# Patient Record
Sex: Female | Born: 1990
Health system: Southern US, Community
[De-identification: ages and names within clinical notes are randomized; demographics above are authoritative.]

## PROBLEM LIST (undated history)

## (undated) ENCOUNTER — Inpatient Hospital Stay (HOSPITAL_COMMUNITY): Payer: Self-pay

## (undated) DIAGNOSIS — Z789 Other specified health status: Secondary | ICD-10-CM

## (undated) DIAGNOSIS — E7800 Pure hypercholesterolemia, unspecified: Secondary | ICD-10-CM

## (undated) DIAGNOSIS — D649 Anemia, unspecified: Secondary | ICD-10-CM

## (undated) DIAGNOSIS — Z973 Presence of spectacles and contact lenses: Secondary | ICD-10-CM

## (undated) DIAGNOSIS — R519 Headache, unspecified: Secondary | ICD-10-CM

## (undated) HISTORY — PX: NO PAST SURGERIES: SHX2092

## (undated) HISTORY — PX: THERAPEUTIC ABORTION: SHX798

---

## 2012-03-27 ENCOUNTER — Ambulatory Visit (INDEPENDENT_AMBULATORY_CARE_PROVIDER_SITE_OTHER): Payer: 59 | Admitting: Family Medicine

## 2012-03-27 VITALS — BP 103/70 | HR 59 | Temp 98.3°F | Resp 16 | Ht 62.5 in | Wt 158.4 lb

## 2012-03-27 DIAGNOSIS — N949 Unspecified condition associated with female genital organs and menstrual cycle: Secondary | ICD-10-CM

## 2012-03-27 DIAGNOSIS — R350 Frequency of micturition: Secondary | ICD-10-CM

## 2012-03-27 LAB — POCT UA - MICROSCOPIC ONLY
Casts, Ur, LPF, POC: NEGATIVE
Crystals, Ur, HPF, POC: NEGATIVE
Mucus, UA: NEGATIVE
Yeast, UA: NEGATIVE

## 2012-03-27 LAB — POCT WET PREP WITH KOH: KOH Prep POC: POSITIVE

## 2012-03-27 LAB — POCT URINALYSIS DIPSTICK
Bilirubin, UA: NEGATIVE
Blood, UA: NEGATIVE
Glucose, UA: NEGATIVE
Ketones, UA: NEGATIVE
Nitrite, UA: NEGATIVE
Protein, UA: NEGATIVE
Spec Grav, UA: 1.02
Urobilinogen, UA: 0.2
pH, UA: 7

## 2012-03-27 MED ORDER — FLUCONAZOLE 150 MG PO TABS: 150.0000 mg | ORAL_TABLET | Freq: Once | ORAL | Status: AC

## 2012-03-27 NOTE — Progress Notes (Signed)
Patient Name: Rhonda Pierce Date of Birth: 26-Feb-1991 Medical Record Number: [id number] Gender: female Date of Encounter: 03/27/2012  History of Present Illness:  Rhonda Pierce is a 21 y.o. very pleasant female patient who presents with the following:  She is here to evaluate a vaginal complaint.  She has noted some burning and discomfort for one day.  She also has noted some thick white discharge.  No urinary frequency or pain.  Her vulvar area feels "hot."    LMP last week- she is still bleeding slightly- spotting.   There is no problem list on file for this patient.  No past medical history on file. No past surgical history on file. History  Substance Use Topics  . Smoking status: Current Everyday Smoker  . Smokeless tobacco: Not on file  . Alcohol Use: Not on file   No family history on file. No Known Allergies  Medication list has been reviewed and updated.  Prior to Admission medications   Not on File    Review of Systems:  As per HPI- otherwise negative. She does not feel that she is at any particular risk for STI  Physical Examination: Filed Vitals:   03/27/12 1516  BP: 103/70  Pulse: 59  Temp: 98.3 F (36.8 C)  Resp: 16   Filed Vitals:   03/27/12 1516  Height: 5' 2.5" (1.588 m)  Weight: 158 lb 6.4 oz (71.85 kg)   Body mass index is 28.51 kg/(m^2). Ideal Body Weight: Weight in (lb) to have BMI = 25: 138.6   GEN: WDWN, NAD, Non-toxic, A & O x 3 HEENT: Atraumatic, Normocephalic. Neck supple. No masses, No LAD. Ears and Nose: No external deformity. CV: RRR, No M/G/R. No JVD. No thrill. No extra heart sounds. PULM: CTA B, no wheezes, crackles, rhonchi. No retractions. No resp. distress. No accessory muscle use. ABD: S, NT, ND, +BS. No rebound. No HSM. EXTR: No c/c/e NEURO Normal gait.  PSYCH: Normally interactive. Conversant. Not depressed or anxious appearing.  Calm demeanor.  GU: externally normal.  Vaginal vault with some irritation and thick white  discharge typical of yeast.  No lesions.  No CMT, adnexa wnl  Results for orders placed in visit on 03/27/12  POCT UA - MICROSCOPIC ONLY      Component Value Range   WBC, Ur, HPF, POC 3-5     RBC, urine, microscopic 2-3     Bacteria, U Microscopic 1+     Mucus, UA neg     Epithelial cells, urine per micros 0-2     Crystals, Ur, HPF, POC neg     Casts, Ur, LPF, POC neg     Yeast, UA neg    POCT URINALYSIS DIPSTICK      Component Value Range   Color, UA yellow     Clarity, UA cloudy     Glucose, UA neg     Bilirubin, UA neg     Ketones, UA neg     Spec Grav, UA 1.020     Blood, UA neg     pH, UA 7.0     Protein, UA neg     Urobilinogen, UA 0.2     Nitrite, UA neg     Leukocytes, UA small (1+)    POCT WET PREP WITH KOH      Component Value Range   Trichomonas, UA Negative     Clue Cells Wet Prep HPF POC 2-3     Epithelial Wet Prep HPF POC  10-15     Yeast Wet Prep HPF POC 3+ buds     Bacteria Wet Prep HPF POC 3+     RBC Wet Prep HPF POC 0-1     WBC Wet Prep HPF POC 10-17     KOH Prep POC Positive       Assessment and Plan: 1. Urinary frequency  POCT UA - Microscopic Only, POCT urinalysis dipstick, Urine culture  2. Vaginal discomfort  POCT Wet Prep with KOH, GC/chlamydia probe amp, genital, fluconazole (DIFLUCAN) 150 MG tablet   Yeast vaginitis.  Treat with diflucan as above.  Await other labs.    Abbe Amsterdam, MD

## 2012-03-28 LAB — GC/CHLAMYDIA PROBE AMP, GENITAL
Chlamydia, DNA Probe: NEGATIVE
GC Probe Amp, Genital: NEGATIVE

## 2012-12-16 ENCOUNTER — Ambulatory Visit: Payer: 59 | Admitting: Family Medicine

## 2012-12-16 ENCOUNTER — Ambulatory Visit: Payer: 59

## 2012-12-16 VITALS — BP 116/80 | HR 72 | Temp 98.5°F | Resp 12 | Ht 63.0 in | Wt 144.0 lb

## 2012-12-16 DIAGNOSIS — N898 Other specified noninflammatory disorders of vagina: Secondary | ICD-10-CM

## 2012-12-16 DIAGNOSIS — B9689 Other specified bacterial agents as the cause of diseases classified elsewhere: Secondary | ICD-10-CM | POA: Insufficient documentation

## 2012-12-16 DIAGNOSIS — L241 Irritant contact dermatitis due to oils and greases: Secondary | ICD-10-CM

## 2012-12-16 DIAGNOSIS — Z3009 Encounter for other general counseling and advice on contraception: Secondary | ICD-10-CM

## 2012-12-16 DIAGNOSIS — B3731 Acute candidiasis of vulva and vagina: Secondary | ICD-10-CM

## 2012-12-16 DIAGNOSIS — B373 Candidiasis of vulva and vagina: Secondary | ICD-10-CM | POA: Insufficient documentation

## 2012-12-16 DIAGNOSIS — R300 Dysuria: Secondary | ICD-10-CM

## 2012-12-16 DIAGNOSIS — N760 Acute vaginitis: Secondary | ICD-10-CM

## 2012-12-16 LAB — POCT WET PREP WITH KOH
KOH Prep POC: POSITIVE
Trichomonas, UA: NEGATIVE
Yeast Wet Prep HPF POC: POSITIVE

## 2012-12-16 LAB — POCT URINE PREGNANCY: Preg Test, Ur: NEGATIVE

## 2012-12-16 MED ORDER — FLUCONAZOLE 150 MG PO TABS: 150.0000 mg | ORAL_TABLET | Freq: Once | ORAL | Status: DC

## 2012-12-16 MED ORDER — NORGESTIMATE-ETH ESTRADIOL 0.25-35 MG-MCG PO TABS: 1.0000 | ORAL_TABLET | Freq: Every day | ORAL | Status: DC

## 2012-12-16 MED ORDER — METRONIDAZOLE 500 MG PO TABS: 500.0000 mg | ORAL_TABLET | Freq: Two times a day (BID) | ORAL | Status: DC

## 2012-12-16 NOTE — Patient Instructions (Addendum)
Make your follow up appointment with me in 1 to 2 months for your complete physical - we can do your pap smear and routine lab work at that time.  Check with your insurance - this should be completely covered and not have any charge even if you and your family has not met your health insurance deductible.  Monilial Vaginitis Vaginitis in a soreness, swelling and redness (inflammation) of the vagina and vulva. Monilial vaginitis is not a sexually transmitted infection. CAUSES  Yeast vaginitis is caused by yeast (candida) that is normally found in your vagina. With a yeast infection, the candida has overgrown in number to a point that upsets the chemical balance. SYMPTOMS   White, thick vaginal discharge.  Swelling, itching, redness and irritation of the vagina and possibly the lips of the vagina (vulva).  Burning or painful urination.  Painful intercourse. DIAGNOSIS  Things that may contribute to monilial vaginitis are:  Postmenopausal and virginal states.  Pregnancy.  Infections.  Being tired, sick or stressed, especially if you had monilial vaginitis in the past.  Diabetes. Good control will help lower the chance.  Birth control pills.  Tight fitting garments.  Using bubble bath, feminine sprays, douches or deodorant tampons.  Taking certain medications that kill germs (antibiotics).  Sporadic recurrence can occur if you become ill. TREATMENT  Your caregiver will give you medication.  There are several kinds of anti monilial vaginal creams and suppositories specific for monilial vaginitis. For recurrent yeast infections, use a suppository or cream in the vagina 2 times a week, or as directed.  Anti-monilial or steroid cream for the itching or irritation of the vulva may also be used. Get your caregiver's permission.  Painting the vagina with methylene blue solution may help if the monilial cream does not work.  Eating yogurt may help prevent monilial vaginitis. HOME  CARE INSTRUCTIONS   Finish all medication as prescribed.  Do not have sex until treatment is completed or after your caregiver tells you it is okay.  Take warm sitz baths.  Do not douche.  Do not use tampons, especially scented ones.  Wear cotton underwear.  Avoid tight pants and panty hose.  Tell your sexual partner that you have a yeast infection. They should go to their caregiver if they have symptoms such as mild rash or itching.  Your sexual partner should be treated as well if your infection is difficult to eliminate.  Practice safer sex. Use condoms.  Some vaginal medications cause latex condoms to fail. Vaginal medications that harm condoms are:  Cleocin cream.  Butoconazole (Femstat).  Terconazole (Terazol) vaginal suppository.  Miconazole (Monistat) (may be purchased over the counter). SEEK MEDICAL CARE IF:   You have a temperature by mouth above 102 F (38.9 C).  The infection is getting worse after 2 days of treatment.  The infection is not getting better after 3 days of treatment.  You develop blisters in or around your vagina.  You develop vaginal bleeding, and it is not your menstrual period.  You have pain when you urinate.  You develop intestinal problems.  You have pain with sexual intercourse. Document Released: 06/29/2005 Document Revised: 12/12/2011 Document Reviewed: 03/13/2009 [facility] Patient Information 2013 [place], [place].  Bacterial Vaginosis Bacterial vaginosis (BV) is a vaginal infection where the normal balance of bacteria in the vagina is disrupted. The normal balance is then replaced by an overgrowth of certain bacteria. There are several different kinds of bacteria that can cause BV. BV is the most  common vaginal infection in women of childbearing age. CAUSES   The cause of BV is not fully understood. BV develops when there is an increase or imbalance of harmful bacteria.  Some activities or behaviors can upset the normal  balance of bacteria in the vagina and put women at increased risk including:  Having a new sex partner or multiple sex partners.  Douching.  Using an intrauterine device (IUD) for contraception.  It is not clear what role sexual activity plays in the development of BV. However, women that have never had sexual intercourse are rarely infected with BV. Women do not get BV from toilet seats, bedding, swimming pools or from touching objects around them.  SYMPTOMS   Grey vaginal discharge.  A fish-like odor with discharge, especially after sexual intercourse.  Itching or burning of the vagina and vulva.  Burning or pain with urination.  Some women have no signs or symptoms at all. DIAGNOSIS  Your caregiver must examine the vagina for signs of BV. Your caregiver will perform lab tests and look at the sample of vaginal fluid through a microscope. They will look for bacteria and abnormal cells (clue cells), a pH test higher than 4.5, and a positive amine test all associated with BV.  RISKS AND COMPLICATIONS   Pelvic inflammatory disease (PID).  Infections following gynecology surgery.  Developing HIV.  Developing herpes virus. TREATMENT  Sometimes BV will clear up without treatment. However, all women with symptoms of BV should be treated to avoid complications, especially if gynecology surgery is planned. Female partners generally do not need to be treated. However, BV may spread between female sex partners so treatment is helpful in preventing a recurrence of BV.   BV may be treated with antibiotics. The antibiotics come in either pill or vaginal cream forms. Either can be used with nonpregnant or pregnant women, but the recommended dosages differ. These antibiotics are not harmful to the baby.  BV can recur after treatment. If this happens, a second round of antibiotics will often be prescribed.  Treatment is important for pregnant women. If not treated, BV can cause a premature  delivery, especially for a pregnant woman who had a premature birth in the past. All pregnant women who have symptoms of BV should be checked and treated.  For chronic reoccurrence of BV, treatment with a type of prescribed gel vaginally twice a week is helpful. HOME CARE INSTRUCTIONS   Finish all medication as directed by your caregiver.  Do not have sex until treatment is completed.  Tell your sexual partner that you have a vaginal infection. They should see their caregiver and be treated if they have problems, such as a mild rash or itching.  Practice safe sex. Use condoms. Only have 1 sex partner. PREVENTION  Basic prevention steps can help reduce the risk of upsetting the natural balance of bacteria in the vagina and developing BV:  Do not have sexual intercourse (be abstinent).  Do not douche.  Use all of the medicine prescribed for treatment of BV, even if the signs and symptoms go away.  Tell your sex partner if you have BV. That way, they can be treated, if needed, to prevent reoccurrence. SEEK MEDICAL CARE IF:   Your symptoms are not improving after 3 days of treatment.  You have increased discharge, pain, or fever. MAKE SURE YOU:   Understand these instructions.  Will watch your condition.  Will get help right away if you are not doing well or get  worse. FOR MORE INFORMATION  Division of STD Prevention (DSTDP), Centers for Disease Control and Prevention: [employer] American Social Health Association (ASHA): www.ashastd.org  Document Released: 09/19/2005 Document Revised: 12/12/2011 Document Reviewed: 03/12/2009 [facility] Patient Information 2013 [place], [place].

## 2012-12-16 NOTE — Progress Notes (Signed)
Subjective:    Patient ID: Rhonda Pierce, female    DOB: 1991-04-09, 22 y.o.   MRN: [id number] Chief Complaint  Patient presents with  . Vaginal Itching    HPI  Thin, white, clumpy vag discharge x 1-2days.  A little pain but is having alittle burning with urination.  Is sexually active with men.  1 partner in past yr.   Periods get delayed with stress. Is really heavy and lasts for over a wk.  Has not used any prescription birth control and usually uses condoms but did have a condom break a month ago after which she took plan B.  She was last sexually active several days ago and has used condoms then and everytime in the past sev wks.  Is interested in starting birth control pills.   Not tired or fatigued, no h/o abnemia.  No back pain, abd pain, no n/v, no pain with sex.  History reviewed. No pertinent past medical history. No current outpatient prescriptions on file prior to visit.   No current facility-administered medications on file prior to visit.   No Known Allergies  Review of Systems  Constitutional: Negative for fever, chills, diaphoresis, activity change, appetite change, fatigue and unexpected weight change.  Gastrointestinal: Negative for nausea, vomiting, abdominal pain, diarrhea, constipation, blood in stool, anal bleeding and rectal pain.  Genitourinary: Positive for dysuria, vaginal discharge and menstrual problem. Negative for urgency, frequency, hematuria, flank pain, decreased urine volume, vaginal bleeding, enuresis, difficulty urinating, genital sores, vaginal pain, pelvic pain and dyspareunia.  Musculoskeletal: Negative for back pain and gait problem.  Skin: Negative for rash.  Hematological: Negative for adenopathy.  Psychiatric/Behavioral: The patient is not nervous/anxious.       BP 116/80  Pulse 72  Temp(Src) 98.5 F (36.9 C) (Oral)  Resp 12  Ht 5\' 3"  (1.6 m)  Wt 144 lb (65.318 kg)  BMI 25.51 kg/m2  SpO2 100% Objective:   Physical Exam  Constitutional:  She is oriented to person, place, and time. She appears well-developed and well-nourished. No distress.  HENT:  Head: Normocephalic and atraumatic.  Cardiovascular: Normal rate, regular rhythm, normal heart sounds and intact distal pulses.   Pulmonary/Chest: Effort normal and breath sounds normal.  Abdominal: Soft. Bowel sounds are normal. She exhibits no distension. There is no tenderness. There is no rebound and no guarding.  Genitourinary: Uterus normal. Pelvic exam was performed with patient supine. There is no rash, tenderness or lesion on the right labia. There is no rash, tenderness or lesion on the left labia. Cervix exhibits no motion tenderness, no discharge and no friability. Right adnexum displays no mass, no tenderness and no fullness. Left adnexum displays no mass, no tenderness and no fullness. No erythema or tenderness around the vagina. Vaginal discharge found.  Moderate amount of thick white discharge  Lymphadenopathy:       Right: No inguinal adenopathy present.       Left: No inguinal adenopathy present.  Neurological: She is alert and oriented to person, place, and time.  Skin: Skin is warm and dry. She is not diaphoretic.  Psychiatric: She has a normal mood and affect. Her behavior is normal.       Results for orders placed in visit on 12/16/12  POCT WET PREP WITH KOH      Result Value Range   Trichomonas, UA Negative     Clue Cells Wet Prep HPF POC 2-16     Epithelial Wet Prep HPF POC 6-24  Yeast Wet Prep HPF POC pos     Bacteria Wet Prep HPF POC 5+     RBC Wet Prep HPF POC TNTC     WBC Wet Prep HPF POC TNTC     KOH Prep POC Positive    POCT URINE PREGNANCY      Result Value Range   Preg Test, Ur Negative     Assessment & Plan:   Dysuria - Plan: POCT Wet Prep with KOH, POCT urine pregnancy, CANCELED: POCT urinalysis dipstick, CANCELED: GC/Chlamydia Probe Amp, CANCELED: POCT UA - Microscopic Only  Vaginal itching - Plan: POCT Wet Prep with KOH, POCT urine  pregnancy, CANCELED: POCT urinalysis dipstick, CANCELED: GC/Chlamydia Probe Amp, CANCELED: POCT UA - Microscopic Only  Vaginal candidiasis  Bacterial vaginitis  Meds ordered this encounter  Medications  . metroNIDAZOLE (FLAGYL) 500 MG tablet    Sig: Take 1 tablet (500 mg total) by mouth 2 (two) times daily with a meal. DO NOT CONSUME ALCOHOL WHILE TAKING THIS MEDICATION.    Dispense:  14 tablet    Refill:  0  . fluconazole (DIFLUCAN) 150 MG tablet    Sig: Take 1 tablet (150 mg total) by mouth once. Repeat if needed    Dispense:  2 tablet    Refill:  0   Pt will follow up for complete physical in 1-2 mos to ensure she is doing ok on the birth control pills and due her pap smear and STD testing.  Pt couldn't afford pap smear today due to deductible but she check with her insurance to ensure an annual preventative exam is covered and return for pap smear and screening labs. Need to discuss missed pills on OCPs w/ pt at f/u.

## 2013-02-04 ENCOUNTER — Other Ambulatory Visit: Payer: Self-pay | Admitting: Family Medicine

## 2013-05-06 ENCOUNTER — Ambulatory Visit: Payer: 59 | Admitting: Physician Assistant

## 2013-05-06 VITALS — BP 120/72 | HR 108 | Temp 101.7°F | Resp 17 | Ht 63.5 in | Wt 142.0 lb

## 2013-05-06 DIAGNOSIS — J029 Acute pharyngitis, unspecified: Secondary | ICD-10-CM

## 2013-05-06 LAB — POCT CBC
Granulocyte percent: 78 %G (ref 37–80)
Hemoglobin: 12.1 g/dL — AB (ref 12.2–16.2)
MCV: 78.7 fL — AB (ref 80–97)
MID (cbc): 0.6 (ref 0–0.9)
MPV: 8.9 fL (ref 0–99.8)
Platelet Count, POC: 245 10*3/uL (ref 142–424)
RBC: 4.87 M/uL (ref 4.04–5.48)

## 2013-05-06 LAB — POCT RAPID STREP A (OFFICE): Rapid Strep A Screen: NEGATIVE

## 2013-05-06 MED ORDER — MAGIC MOUTHWASH W/LIDOCAINE: ORAL | Status: DC

## 2013-05-06 MED ORDER — GUAIFENESIN ER 1200 MG PO TB12: 1.0000 | ORAL_TABLET | Freq: Two times a day (BID) | ORAL | Status: AC

## 2013-05-06 NOTE — Patient Instructions (Addendum)
Advil 200mg  take 4 pills 3x/day with food for fever, sore throat and neck lymph node swelling.

## 2013-05-06 NOTE — Progress Notes (Signed)
[address], [place] [address]   Phone [phone]  Subjective:    Patient ID: Rhonda Pierce, female    DOB: 21-Sep-1991, 22 y.o.   MRN: [id number]  HPI Pt presents to clinic with sore throat that started yesterday.  She is having no cold symptoms other than a cough.  It is worse today and she is concerned because she just started a new job.  She has had subjective fever with chills.  No OTC meds. Friend with similar illness.  Review of Systems  Constitutional: Positive for fever (subjective) and chills.  HENT: Positive for sore throat. Negative for ear pain, congestion, rhinorrhea and postnasal drip.   Respiratory: Negative for cough.   Gastrointestinal: Positive for nausea. Negative for vomiting and diarrhea.  Genitourinary: Negative.   Neurological: Positive for headaches.       Objective:   Physical Exam  Vitals reviewed. Constitutional: She is oriented to person, place, and time. She appears well-developed and well-nourished.  HENT:  Head: Normocephalic and atraumatic.  Right Ear: Hearing, tympanic membrane, external ear and ear canal normal.  Left Ear: Hearing, tympanic membrane, external ear and ear canal normal.  Nose: Mucosal edema (pale and swollen) present.  Mouth/Throat: Mucous membranes are normal. Posterior oropharyngeal erythema (mild) present.  Eyes: Conjunctivae are normal.  Neck: Normal range of motion.  Cardiovascular: Normal rate, regular rhythm and normal heart sounds.   No murmur heard. Pulmonary/Chest: Effort normal and breath sounds normal.  Lymphadenopathy:    She has cervical adenopathy.       Right cervical: Superficial cervical (2+ - tender and palpable) adenopathy present.  Neurological: She is alert and oriented to person, place, and time.  Skin: Skin is warm and dry.  Psychiatric: She has a normal mood and affect. Her behavior is normal. Judgment and thought content normal.   Results for orders placed in visit on 05/06/13  POCT RAPID  STREP A (OFFICE)      Result Value Range   Rapid Strep A Screen Negative  Negative  POCT CBC      Result Value Range   WBC 7.0  4.6 - 10.2 K/uL   Lymph, poc 0.9  0.6 - 3.4   POC LYMPH PERCENT 13.3  10 - 50 %L   MID (cbc) 0.6  0 - 0.9   POC MID % 8.7  0 - 12 %M   POC Granulocyte 5.5  2 - 6.9   Granulocyte percent 78.0  37 - 80 %G   RBC 4.87  4.04 - 5.48 M/uL   Hemoglobin 12.1 (*) 12.2 - 16.2 g/dL   HCT, POC 21.3  08.6 - 47.9 %   MCV 78.7 (*) 80 - 97 fL   MCH, POC 24.8 (*) 27 - 31.2 pg   MCHC 31.6 (*) 31.8 - 35.4 g/dL   RDW, POC 57.8     Platelet Count, POC 245  142 - 424 K/uL   MPV 8.9  0 - 99.8 fL        Assessment & Plan:  Acute pharyngitis -I suspect that pt has a URI with PND and sore throat but due to the size of the lymph node if she is not better in 4-5 days pt will RTC for mono testing but she has been told she has been exposed to it in the past. Plan: POCT rapid strep A, POCT CBC, Alum & Mag Hydroxide-Simeth (MAGIC MOUTHWASH W/LIDOCAINE) SOLN, Guaifenesin (MUCINEX MAXIMUM STRENGTH) 1200 MG TB12 Pt will push fluids and use  tylenol and motrin for the pain in her throat and swollen lymph nodes.  Benny Lennert PA-C 05/06/2013 9:13 PM

## 2013-05-08 ENCOUNTER — Telehealth: Payer: Self-pay

## 2013-05-08 NOTE — Telephone Encounter (Signed)
Pt states that she still has a high fever and would like to know if there is a rx she can be given to get rid of the fever. Also pt states that she will need a note for missing work today because she is not feeling well enough to go in. Best# [phone] [facility] [address]

## 2013-05-08 NOTE — Telephone Encounter (Signed)
Tylenol or Motrin   Work note provided   Left message for her to advise

## 2013-05-10 ENCOUNTER — Ambulatory Visit: Payer: 59 | Admitting: Family Medicine

## 2013-05-10 VITALS — BP 118/70 | HR 90 | Temp 100.4°F | Resp 16 | Ht 62.75 in | Wt 142.0 lb

## 2013-05-10 DIAGNOSIS — R509 Fever, unspecified: Secondary | ICD-10-CM

## 2013-05-10 DIAGNOSIS — J029 Acute pharyngitis, unspecified: Secondary | ICD-10-CM

## 2013-05-10 DIAGNOSIS — J039 Acute tonsillitis, unspecified: Secondary | ICD-10-CM

## 2013-05-10 DIAGNOSIS — B370 Candidal stomatitis: Secondary | ICD-10-CM

## 2013-05-10 LAB — POCT CBC
Granulocyte percent: 86.1 %G — AB (ref 37–80)
HCT, POC: 38.8 % (ref 37.7–47.9)
MCH, POC: 25.1 pg — AB (ref 27–31.2)
MCV: 77.9 fL — AB (ref 80–97)
MID (cbc): 0.5 (ref 0–0.9)
POC LYMPH PERCENT: 9.3 %L — AB (ref 10–50)
Platelet Count, POC: 212 10*3/uL (ref 142–424)
RBC: 4.98 M/uL (ref 4.04–5.48)
WBC: 10.1 10*3/uL (ref 4.6–10.2)

## 2013-05-10 MED ORDER — NYSTATIN 100000 UNIT/ML MT SUSP: 500000.0000 [IU] | Freq: Four times a day (QID) | OROMUCOSAL | Status: DC

## 2013-05-10 MED ORDER — AMOXICILLIN-POT CLAVULANATE 875-125 MG PO TABS: 1.0000 | ORAL_TABLET | Freq: Two times a day (BID) | ORAL | Status: DC

## 2013-05-10 NOTE — Progress Notes (Signed)
[address]   [place], [place]  [address]   [phone]  Subjective:    Patient ID: Rhonda Pierce, female    DOB: [birth year], 22 y.o.   MRN: [id number]  HPI This 22 y.o. female presents for evaluation of persistent sore throat.   Evaluated four days ago; rapid strep negative.  +fever Tmax 101.7.  Taking Tylenol.  Onset five days ago.  +HA. Mild ear pain.  ST diffuse; Pain with swallowing.  Mild nasal congestion.  No rhinorrhea.  No cough.  No n/v.  +diarrhea.  No rash.  Taking Tylenol, Mucinex.   Gums are really sore; sores in mouth; hard to brush teeth.   Review of Systems  Constitutional: Positive for fever, chills, diaphoresis and fatigue.  HENT: Positive for ear pain, congestion, sore throat, trouble swallowing, dental problem and voice change. Negative for rhinorrhea, neck pain and neck stiffness.   Respiratory: Negative for cough and shortness of breath.   Gastrointestinal: Positive for diarrhea. Negative for nausea and vomiting.  Skin: Negative for rash.  Neurological: Positive for headaches. Negative for dizziness.    History reviewed. No pertinent past medical history.  History reviewed. No pertinent past surgical history.  Prior to Admission medications   Medication Sig Start Date End Date Taking? Authorizing Provider  Alum & Mag Hydroxide-Simeth (MAGIC MOUTHWASH W/LIDOCAINE) SOLN 1-2 tsp swish gargle spit q1-2h prn sore throat 05/06/13  Yes Morrell Riddle, PA-C  etonogestrel (NEXPLANON) 68 MG IMPL implant Inject 1 each into the skin once.   Yes Historical Provider, MD  Guaifenesin (MUCINEX MAXIMUM STRENGTH) 1200 MG TB12 Take 1 tablet (1,200 mg total) by mouth 2 (two) times daily. 05/06/13 05/13/13  Morrell Riddle, PA-C    No Known Allergies  History   Social History  . Marital Status: Single    Spouse Name: N/A    Number of Children: N/A  . Years of Education: N/A   Occupational History  . Not on file.   Social History Main Topics  . Smoking status: Never Smoker   .  Smokeless tobacco: Not on file  . Alcohol Use: No  . Drug Use: No  . Sexually Active: Yes    Birth Control/ Protection: None   Other Topics Concern  . Not on file   Social History Narrative  . No narrative on file    Family History  Problem Relation Age of Onset  . Cancer Mother        Objective:   Physical Exam  Nursing note and vitals reviewed. Constitutional: She is oriented to person, place, and time. She appears well-developed and well-nourished. No distress.  HENT:  Head: Normocephalic and atraumatic.  Right Ear: External ear normal.  Left Ear: External ear normal.  Mouth/Throat: Oral lesions present. Oropharyngeal exudate, posterior oropharyngeal edema and posterior oropharyngeal erythema present. No tonsillar abscesses.  Scattered white film along buccal mucosa; erythema along gumline.    Eyes: Conjunctivae and EOM are normal. Pupils are equal, round, and reactive to light.  Neck: Normal range of motion. Neck supple.  Moderate B anterior cervical LAD; +TTP.  Cardiovascular: Normal rate, regular rhythm and normal heart sounds.   Pulmonary/Chest: Effort normal and breath sounds normal.  Lymphadenopathy:    She has cervical adenopathy.  Neurological: She is alert and oriented to person, place, and time.  Skin: Skin is warm and dry. No rash noted. She is not diaphoretic.  Psychiatric: She has a normal mood and affect. Her behavior is normal.   Results  for orders placed in visit on 05/10/13  POCT CBC      Result Value Range   WBC 10.1  4.6 - 10.2 K/uL   Lymph, poc 0.9  0.6 - 3.4   POC LYMPH PERCENT 9.3 (*) 10 - 50 %L   MID (cbc) 0.5  0 - 0.9   POC MID % 4.6  0 - 12 %M   POC Granulocyte 8.7 (*) 2 - 6.9   Granulocyte percent 86.1 (*) 37 - 80 %G   RBC 4.98  4.04 - 5.48 M/uL   Hemoglobin 12.5  12.2 - 16.2 g/dL   HCT, POC 16.1  09.6 - 47.9 %   MCV 77.9 (*) 80 - 97 fL   MCH, POC 25.1 (*) 27 - 31.2 pg   MCHC 32.2  31.8 - 35.4 g/dL   RDW, POC 04.5     Platelet  Count, POC 212  142 - 424 K/uL   MPV 9.7  0 - 99.8 fL       Assessment & Plan:  Sore throat - Plan: Epstein-Barr virus VCA antibody panel, Herpes simplex virus culture, POCT CBC, Culture, Group A Strep  Fever - Plan: Epstein-Barr virus VCA antibody panel, Herpes simplex virus culture, POCT CBC, Culture, Group A Strep  Acute tonsillitis - Plan: amoxicillin-clavulanate (AUGMENTIN) 875-125 MG per tablet  Thrush - Plan: nystatin (MYCOSTATIN) 100000 UNIT/ML suspension  1.  Tonsillitis:  New.  Send throat culture, EBV titers, HSV culture.  Treat with Augmentin. 2.  Thrush: New. Versus stomatitis yet white exudate along buccal mucosa.  Rx for Nystatin swish and swallow.    Meds ordered this encounter  Medications  . amoxicillin-clavulanate (AUGMENTIN) 875-125 MG per tablet    Sig: Take 1 tablet by mouth 2 (two) times daily.    Dispense:  20 tablet    Refill:  0  . nystatin (MYCOSTATIN) 100000 UNIT/ML suspension    Sig: Take 5 mLs (500,000 Units total) by mouth 4 (four) times daily.    Dispense:  150 mL    Refill:  0

## 2013-05-11 ENCOUNTER — Telehealth: Payer: Self-pay

## 2013-05-11 DIAGNOSIS — B001 Herpesviral vesicular dermatitis: Secondary | ICD-10-CM

## 2013-05-11 LAB — EPSTEIN-BARR VIRUS VCA ANTIBODY PANEL
EBV NA IgG: 39.8 U/mL — ABNORMAL HIGH (ref <18.0)
EBV VCA IgG: 15 U/mL (ref <18.0)
EBV VCA IgM: <10 U/mL (ref <36.0)

## 2013-05-11 MED ORDER — VALACYCLOVIR HCL 1 G PO TABS: 1000.0000 mg | ORAL_TABLET | Freq: Two times a day (BID) | ORAL | Status: DC

## 2013-05-11 NOTE — Telephone Encounter (Signed)
Pt states that she is now having blisters in her mouth and she can hardly eat.  She states that her friend had some type of herpes virus and she would like to go ahead and be treated because her mouth hurts.

## 2013-05-11 NOTE — Telephone Encounter (Signed)
Pt notified that rx was sent in 

## 2013-05-11 NOTE — Telephone Encounter (Signed)
1) Patient is on antibiotic. 2) I will go ahead and send in Valtrex which is an antiviral. 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  3) Await remaining pending labs, including HSV culture

## 2013-05-11 NOTE — Telephone Encounter (Signed)
Pt would like an antibiotic prescribed for what she was seen for earlier this week.  She has sores in mouth and it burns when she eats. Please call when ready.

## 2013-05-13 LAB — HERPES SIMPLEX VIRUS CULTURE: Organism ID, Bacteria: DETECTED

## 2014-05-29 ENCOUNTER — Ambulatory Visit (INDEPENDENT_AMBULATORY_CARE_PROVIDER_SITE_OTHER): Payer: 59 | Admitting: Family Medicine

## 2014-05-29 VITALS — BP 102/72 | HR 72 | Temp 98.4°F | Resp 18 | Ht 63.0 in | Wt 159.8 lb

## 2014-05-29 DIAGNOSIS — N920 Excessive and frequent menstruation with regular cycle: Secondary | ICD-10-CM

## 2014-05-29 DIAGNOSIS — T148 Other injury of unspecified body region: Secondary | ICD-10-CM

## 2014-05-29 DIAGNOSIS — R58 Hemorrhage, not elsewhere classified: Secondary | ICD-10-CM

## 2014-05-29 DIAGNOSIS — W57XXXA Bitten or stung by nonvenomous insect and other nonvenomous arthropods, initial encounter: Secondary | ICD-10-CM

## 2014-05-29 DIAGNOSIS — I998 Other disorder of circulatory system: Secondary | ICD-10-CM

## 2014-05-29 MED ORDER — DOXYCYCLINE HYCLATE 100 MG PO TABS: 100.0000 mg | ORAL_TABLET | Freq: Two times a day (BID) | ORAL | Status: DC

## 2014-05-29 MED ORDER — MEDROXYPROGESTERONE ACETATE 5 MG PO TABS: 5.0000 mg | ORAL_TABLET | Freq: Every day | ORAL | Status: DC

## 2014-05-29 NOTE — Progress Notes (Signed)
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 She pulled off the tick and noticed that it's was ecchymotic and was scared that she might be developing Lyme disease.  Patient also had a problem with bleeding for the last 3 weeks. She has a Nexplanon in her left arm. Said no cramping.  Objective: 1 cm area of ecchymosis in the left inguinal region  Abdomen soft nontender  Assessment: Simple tick bite with ecchymosis the left ankle region, menorrhagia secondary to hormonal imbalance  Tick bite - Plan: doxycycline (VIBRA-TABS) 100 MG tablet  Ecchymosis - Plan: doxycycline (VIBRA-TABS) 100 MG tablet  Menorrhagia with regular cycle - Plan: medroxyPROGESTERone (PROVERA) 5 MG tablet Patient told to call if bleeding persists more than 48 hours Signed, Robyn Haber, MD

## 2014-05-29 NOTE — Patient Instructions (Addendum)
Please call me if bleeding persists after two days of Provera.    Tick Bite Information Ticks are insects that attach themselves to the skin and draw blood for food. There are various types of ticks. Common types include wood ticks and deer ticks. Most ticks live in shrubs and grassy areas. Ticks can climb onto your body when you make contact with leaves or grass where the tick is waiting. The most common places on the body for ticks to attach themselves are the scalp, neck, armpits, waist, and groin. Most tick bites are harmless, but sometimes ticks carry germs that cause diseases. These germs can be spread to a person during the tick's feeding process. The chance of a disease spreading through a tick bite depends on:   The type of tick.  Time of year.   How long the tick is attached.   Geographic location.  HOW CAN YOU PREVENT TICK BITES? Take these steps to help prevent tick bites when you are outdoors:  Wear protective clothing. Long sleeves and long pants are best.   Wear white clothes so you can see ticks more easily.  Tuck your pant legs into your socks.   If walking on a trail, stay in the middle of the trail to avoid brushing against bushes.  Avoid walking through areas with long grass.  Put insect repellent on all exposed skin and along boot tops, pant legs, and sleeve cuffs.   Check clothing, hair, and skin repeatedly and before going inside.   Brush off any ticks that are not attached.  Take a shower or bath as soon as possible after being outdoors.  WHAT IS THE PROPER WAY TO REMOVE A TICK? Ticks should be removed as soon as possible to help prevent diseases caused by tick bites. 1. If latex gloves are available, put them on before trying to remove a tick.  2. Using fine-point tweezers, grasp the tick as close to the skin as possible. You may also use curved forceps or a tick removal tool. Grasp the tick as close to its head as possible. Avoid grasping the  tick on its body. 3. Pull gently with steady upward pressure until the tick lets go. Do not twist the tick or jerk it suddenly. This may break off the tick's head or mouth parts. 4. Do not squeeze or crush the tick's body. This could force disease-carrying fluids from the tick into your body.  5. After the tick is removed, wash the bite area and your hands with soap and water or other disinfectant such as alcohol. 6. Apply a small amount of antiseptic cream or ointment to the bite site.  7. Wash and disinfect any instruments that were used.  Do not try to remove a tick by applying a hot match, petroleum jelly, or fingernail polish to the tick. These methods do not work and may increase the chances of disease being spread from the tick bite.  WHEN SHOULD YOU SEEK MEDICAL CARE? Contact your health care provider if you are unable to remove a tick from your skin or if a part of the tick breaks off and is stuck in the skin.  After a tick bite, you need to be aware of signs and symptoms that could be related to diseases spread by ticks. Contact your health care provider if you develop any of the following in the days or weeks after the tick bite:  Unexplained fever.  Rash. A circular rash that appears days or weeks after  the tick bite may indicate the possibility of Lyme disease. The rash may resemble a target with a bull's-eye and may occur at a different part of your body than the tick bite.  Redness and swelling in the area of the tick bite.   Tender, swollen lymph glands.   Diarrhea.   Weight loss.   Cough.   Fatigue.   Muscle, joint, or bone pain.   Abdominal pain.   Headache.   Lethargy or a change in your level of consciousness.  Difficulty walking or moving your legs.   Numbness in the legs.   Paralysis.  Shortness of breath.   Confusion.   Repeated vomiting.  Document Released: 09/16/2000 Document Revised: 07/10/2013 Document Reviewed:  02/27/2013 [facility] Patient Information 2015 [place], [place]. This information is not intended to replace advice given to you by your health care provider. Make sure you discuss any questions you have with your health care provider.

## 2015-01-31 ENCOUNTER — Ambulatory Visit (INDEPENDENT_AMBULATORY_CARE_PROVIDER_SITE_OTHER): Payer: Self-pay | Admitting: Family Medicine

## 2015-01-31 VITALS — BP 118/82 | HR 88 | Temp 98.2°F | Ht 63.0 in | Wt 174.4 lb

## 2015-01-31 DIAGNOSIS — Z3009 Encounter for other general counseling and advice on contraception: Secondary | ICD-10-CM

## 2015-01-31 DIAGNOSIS — K0500 Acute gingivitis, plaque induced: Secondary | ICD-10-CM

## 2015-01-31 LAB — POCT URINE PREGNANCY: PREG TEST UR: NEGATIVE

## 2015-01-31 MED ORDER — PENICILLIN V POTASSIUM 500 MG PO TABS: 500.0000 mg | ORAL_TABLET | Freq: Four times a day (QID) | ORAL | Status: DC

## 2015-01-31 MED ORDER — PREDNISONE 10 MG PO TABS: ORAL_TABLET | ORAL | Status: DC

## 2015-01-31 NOTE — Patient Instructions (Signed)
Trench Mouth Trench mouth is a sudden, painful sore (ulceration) of the gums that is caused by bacteria. Trench mouth is a painful form of gingivitis. Gingivitis means redness and soreness of the gums. The term "trench mouth" comes from World War I. The condition was common when the disorder appeared among soldiers in the trenches. The mouth normally contains a balance of all the germs growing in it. When the balance is upset and too many germs start growing, it can cause painful ulcers on the gums. It is an uncommon disorder which usually affects young adults. CAUSES   Poor nutrition.  Poor oral hygiene.  Smoking.  Emotional distress. SYMPTOMS   Painful gums which are red, swollen, and covered with a grayish film. There may be destruction of tissue between the teeth due to the ulcerations.  Gum bleeding with even minor brushing or irritation.  Ulcerations of the gums along with a bad taste in the mouth and bad breath.  Fever and swollen glands in the neck. DIAGNOSIS  Your caregiver can usually make this diagnosis by a physical exam. Sometimes, X-rays and cultures may be used to help with the diagnosis. TREATMENT  Treatment is aimed at relief of symptoms and getting rid of the infection. Antibiotic medicine may be prescribed. HOME CARE INSTRUCTIONS  Good oral hygiene is necessary. Thorough toothbrushing and flossing must be performed often. This should be done after meals and at bedtime. Your caregiver may be able to help by suggesting a soothing rinse or the use of a local pain medicine, such as viscous lidocaine, before brushing.  Warm salt water rinses made up of 1 tsp of salt in 2 cups of warm water may be helpful. If the rinse is painful, add more water.  Only take over-the-counter or prescription medicines for pain, discomfort, or fever as directed by your caregiver.  Take your antibiotics as directed. Finish them even if you start to feel better.  Follow your dentist's  instructions for teeth cleaning during this infection. Follow up with all your caregivers as directed.  Avoid smoking, alcohol, and irritating foods. You will know immediately which foods irritate your mouth. Typically, these will be spicy, sour, or citrus foods. SEEK IMMEDIATE MEDICAL CARE IF:   You develop pain or swelling in your face.  You have a fever.  You are unable to take fluids or eat food comfortably.  Your medicines do not seem to be working and you feel you are getting worse.  Your medicines are not relieving your pain.  You develop a stiff neck or a severe headache, which is unrelieved with medicines. MAKE SURE YOU:  Understand these instructions.  Will watch your condition.  Will get help right away if you are not doing well or get worse. Document Released: 12/23/2004 Document Revised: 12/12/2011 Document Reviewed: 04/28/2011 [facility] Patient Information 2015 [place], [place]. This information is not intended to replace advice given to you by your health care provider. Make sure you discuss any questions you have with your health care provider. Gingivitis Gingivitis is a form of gum (periodontal) disease that causes redness, soreness, and swelling (inflammation) of your gums. CAUSES The most common cause of gingivitis is poor oral hygiene. A sticky substance made of bacteria, mucus, and food particles (plaque), is deposited on the exposed part of teeth. As plaque builds up, it reacts with the saliva in your mouth to form something called  tartar. Tartar is a hard deposit that becomes trapped around the base of the tooth. Plaque and  tartar irritate the gums, leading to the formation of gingivitis. Other factors that increase your risk for gingivitis include:   Tobacco use.  Diabetes.  Older age.  Certain medications.  Certain viral or fungal infections.  Dry mouth.  Hormonal changes such as during pregnancy.  Poor nutrition.  Substance abuse.  Poor fitting  dental restorations or appliances. SYMPTOMS You may notice inflammation of the soft tissue (gingiva) around the teeth. When these tissues become inflamed, they bleed easily, especially during flossing or brushing. The gums may also be:   Tender to the touch.  Bright red, purple red, or have a shiny appearance.  Swollen.  Wearing away from the teeth (receding), which exposes more of the tooth. Bad breath is often present. Continued infection around teeth can eventually cause cavities and loosen teeth. This may lead to eventual tooth loss. DIAGNOSIS A medical and dental history will be taken. Your mouth, teeth, and gums will be examined. Your dentist will look for soft, swollen purple-red, irritated gums. There may be deposits of plaque and tartar at the base of the teeth. Your gums will be looked at for the degree of redness, puffiness, and bleeding tendencies. Your dentist will see if any of the teeth are loose. X-rays may be taken to see if the inflammation has spread to the supporting structures of the teeth. TREATMENT The goal is to reduce and reverse the inflammation. Proper treatment can usually reverse the symptoms of gingivitis and prevent further progression of the disease. Have your teeth cleaned. During the cleaning, all plaque and tartar will be removed. Instruction for proper home care will be given. You will need regular professional cleanings and check-ups in the future. HOME CARE INSTRUCTIONS  Brush your teeth twice a day and floss at least once per day. When flossing, it is best to floss first then brush.  Limit sugar between meals and maintain a well-balanced diet.  Even the best dental hygiene will not prevent plaque from developing. It is necessary for you to see your dentist on a regular basis for cleaning and regular checkups.  Your dentist can recommend proper oral hygiene and mouth care and suggest special toothpastes or mouth rinses.  Stop smoking. SEEK DENTAL OR  MEDICAL CARE IF:  You have painful, reddened tissue around your teeth, or you have puffy swollen gums.  You have difficulty chewing.  You notice any loose or infected teeth.  You have swollen glands.  Your gums bleed easily when you brush your teeth or are very tender to the touch. Document Released: 03/15/2001 Document Revised: 12/12/2011 Document Reviewed: 12/24/2010 [facility] Patient Information 2015 [place], [place]. This information is not intended to replace advice given to you by your health care provider. Make sure you discuss any questions you have with your health care provider.

## 2015-01-31 NOTE — Progress Notes (Signed)
Subjective:    Patient ID: Rhonda Pierce, female    DOB: 09-25-1991, 24 y.o.   MRN: [id number] Chief Complaint  Patient presents with  . Oral Swelling    C/O mouth & gum swelling x 2 days    HPI  She takes a deep No dental problems.  Sometimes it is difficult to take a deep breath.  2 nights ago when she was laying on her left side she had left chest pain - resolved when she rolled to her back but now feeling some central chest pressure last night - now resolved. No h/o GERD/gastritis.  Did try theraflu, mucinex, delsym, tylenol, advil, and dayquil when she had flu-like sxs 2 wks ago.  Not on birth control now - LMP was 3/19 - stopped birth control last mo not wanting to become pregnant. Had nexplanon and caused her to gain weight as well as metrorrhagia w/ daily spooting for months, made her emotions all over the place. She has tried pills in the past but just fo    No past medical history on file. No current outpatient prescriptions on file prior to visit.   No current facility-administered medications on file prior to visit.   No Known Allergies  Review of Systems  Constitutional: Positive for fatigue and unexpected weight change.  HENT: Negative for dental problem.   Respiratory: Positive for chest tightness and shortness of breath.   Cardiovascular: Positive for chest pain.  Gastrointestinal: Negative for nausea, vomiting, abdominal pain and abdominal distention.  Genitourinary: Negative for menstrual problem.  Musculoskeletal: Positive for myalgias and arthralgias.  Skin: Negative for rash.  Psychiatric/Behavioral: Positive for behavioral problems and sleep disturbance. The patient is nervous/anxious.        Objective:  BP 118/82 mmHg  Pulse 88  Temp(Src) 98.2 F (36.8 C) (Oral)  Ht 5\' 3"  (1.6 m)  Wt 174 lb 6 oz (79.096 kg)  BMI 30.90 kg/m2  SpO2 98%  Physical Exam  Constitutional: She is oriented to person, place, and time. She appears well-developed and  well-nourished. No distress.  HENT:  Head: Normocephalic and atraumatic.  Right Ear: External ear normal.  Left Ear: External ear normal.  Eyes: Conjunctivae are normal. No scleral icterus.  Neck: Normal range of motion. Neck supple. No thyromegaly present.  Cardiovascular: Normal rate, regular rhythm, normal heart sounds and intact distal pulses.   Pulmonary/Chest: Effort normal and breath sounds normal. No respiratory distress.  Musculoskeletal: She exhibits no edema.  Lymphadenopathy:    She has no cervical adenopathy.  Neurological: She is alert and oriented to person, place, and time.  Skin: Skin is warm and dry. She is not diaphoretic. No erythema.  Psychiatric: She has a normal mood and affect. Her behavior is normal.   Results for orders placed or performed in visit on 01/31/15  POCT urine pregnancy  Result Value Ref Range   Preg Test, Ur Negative        Assessment & Plan:   1. Gingivitis, acute   2. Family planning     Orders Placed This Encounter  Procedures  . POCT urine pregnancy    Meds ordered this encounter  Medications  . penicillin v potassium (VEETID) 500 MG tablet    Sig: Take 1 tablet (500 mg total) by mouth 4 (four) times daily.    Dispense:  56 tablet    Refill:  0  . predniSONE (DELTASONE) 10 MG tablet    Sig: 6-5-4-3-2-1 tabs po qd    Dispense:  21 tablet    Refill:  0    I personally performed the services described in this documentation, which was scribed in my presence. The recorded information has been reviewed and considered, and addended by me as needed.  Delman Cheadle, MD MPH

## 2015-10-04 NOTE — L&D Delivery Note (Signed)
Delivery Note  At 5:55 PM a viable female named Rhonda Pierce  was delivered via Vaginal, Spontaneous Delivery (Presentation: ROA ).  APGAR: 9, 9 Placenta status: spontaneous and complete with 3Vx Cord  Anesthesia:  Epidural Episiotomy: None Lacerations: 1st degree;clitoridal Suture Repair: 3.0 Monocryl Est. Blood Loss (mL): 400  Mom to postpartum.  Baby to Couplet care / Skin to Skin  Plans breastfeeding and bottlefeeding No Circumcision  Gad Aymond A 06/08/2016, 6:55 PM

## 2015-10-08 ENCOUNTER — Encounter (HOSPITAL_COMMUNITY): Payer: Self-pay | Admitting: *Deleted

## 2015-10-08 ENCOUNTER — Inpatient Hospital Stay (HOSPITAL_COMMUNITY): Payer: Medicaid Other

## 2015-10-08 ENCOUNTER — Inpatient Hospital Stay (HOSPITAL_COMMUNITY)
Admission: AD | Admit: 2015-10-08 | Discharge: 2015-10-08 | Disposition: A | Discharge status: Home / Self Care | Payer: Medicaid Other | Source: Ambulatory Visit | Attending: Family Medicine | Admitting: Family Medicine

## 2015-10-08 DIAGNOSIS — R102 Pelvic and perineal pain: Secondary | ICD-10-CM | POA: Diagnosis not present

## 2015-10-08 DIAGNOSIS — O26891 Other specified pregnancy related conditions, first trimester: Secondary | ICD-10-CM | POA: Insufficient documentation

## 2015-10-08 DIAGNOSIS — O99331 Smoking (tobacco) complicating pregnancy, first trimester: Secondary | ICD-10-CM | POA: Insufficient documentation

## 2015-10-08 DIAGNOSIS — Z3A01 Less than 8 weeks gestation of pregnancy: Secondary | ICD-10-CM | POA: Diagnosis not present

## 2015-10-08 DIAGNOSIS — R109 Unspecified abdominal pain: Secondary | ICD-10-CM | POA: Diagnosis present

## 2015-10-08 LAB — URINALYSIS, ROUTINE W REFLEX MICROSCOPIC
Bilirubin Urine: NEGATIVE
Glucose, UA: NEGATIVE mg/dL
HGB URINE DIPSTICK: NEGATIVE
KETONES UR: 15 mg/dL — AB
Leukocytes, UA: NEGATIVE
Nitrite: NEGATIVE
PROTEIN: NEGATIVE mg/dL
Specific Gravity, Urine: 1.02 (ref 1.005–1.030)
pH: 6 (ref 5.0–8.0)

## 2015-10-08 LAB — CBC
HEMATOCRIT: 35.4 % — AB (ref 36.0–46.0)
Hemoglobin: 11.8 g/dL — ABNORMAL LOW (ref 12.0–15.0)
MCH: 23.9 pg — ABNORMAL LOW (ref 26.0–34.0)
MCHC: 33.3 g/dL (ref 30.0–36.0)
MCV: 71.8 fL — AB (ref 78.0–100.0)
Platelets: 274 10*3/uL (ref 150–400)
RBC: 4.93 MIL/uL (ref 3.87–5.11)
RDW: 14.7 % (ref 11.5–15.5)
WBC: 10.9 10*3/uL — AB (ref 4.0–10.5)

## 2015-10-08 LAB — WET PREP, GENITAL
Clue Cells Wet Prep HPF POC: NONE SEEN
SPERM: NONE SEEN
Trich, Wet Prep: NONE SEEN
YEAST WET PREP: NONE SEEN

## 2015-10-08 LAB — HCG, QUANTITATIVE, PREGNANCY: hCG, Beta Chain, Quant, S: 3699 m[IU]/mL — ABNORMAL HIGH (ref <5)

## 2015-10-08 MED ORDER — PROMETHAZINE HCL 25 MG PO TABS: 12.5000 mg | ORAL_TABLET | Freq: Four times a day (QID) | ORAL | Status: DC | PRN

## 2015-10-08 NOTE — Discharge Instructions (Signed)
Morning Sickness Morning sickness is when you feel sick to your stomach (nauseous) during pregnancy. This nauseous feeling may or may not come with vomiting. It often occurs in the morning but can be a problem any time of day. Morning sickness is most common during the first trimester, but it may continue throughout pregnancy. While morning sickness is unpleasant, it is usually harmless unless you develop severe and continual vomiting (hyperemesis gravidarum). This condition requires more intense treatment.  CAUSES  The cause of morning sickness is not completely known but seems to be related to normal hormonal changes that occur in pregnancy. RISK FACTORS You are at greater risk if you:  Experienced nausea or vomiting before your pregnancy.  Had morning sickness during a previous pregnancy.  Are pregnant with more than one baby, such as twins. TREATMENT  Do not use any medicines (prescription, over-the-counter, or herbal) for morning sickness without first talking to your health care provider. Your health care provider may prescribe or recommend:  Vitamin B6 supplements.  Anti-nausea medicines.  The herbal medicine ginger. HOME CARE INSTRUCTIONS   Only take over-the-counter or prescription medicines as directed by your health care provider.  Taking multivitamins before getting pregnant can prevent or decrease the severity of morning sickness in most women.  Eat a piece of dry toast or unsalted crackers before getting out of bed in the morning.  Eat five or six small meals a day.  Eat dry and bland foods (rice, baked potato). Foods high in carbohydrates are often helpful.  Do not drink liquids with your meals. Drink liquids between meals.  Avoid greasy, fatty, and spicy foods.  Get someone to cook for you if the smell of any food causes nausea and vomiting.  If you feel nauseous after taking prenatal vitamins, take the vitamins at night or with a snack.  Snack on protein  foods (nuts, yogurt, cheese) between meals if you are hungry.  Eat unsweetened gelatins for desserts.  Wearing an acupressure wristband (worn for sea sickness) may be helpful.  Acupuncture may be helpful.  Do not smoke.  Get a humidifier to keep the air in your house free of odors.  Get plenty of fresh air. SEEK MEDICAL CARE IF:   Your home remedies are not working, and you need medicine.  You feel dizzy or lightheaded.  You are losing weight. SEEK IMMEDIATE MEDICAL CARE IF:   You have persistent and uncontrolled nausea and vomiting.  You pass out (faint). MAKE SURE YOU:  Understand these instructions.  Will watch your condition.  Will get help right away if you are not doing well or get worse.   This information is not intended to replace advice given to you by your health care provider. Make sure you discuss any questions you have with your health care provider.   Document Released: 11/10/2006 Document Revised: 09/24/2013 Document Reviewed: 03/06/2013 Elsevier Interactive Patient Education 2016 [place] of Pregnancy The first trimester of pregnancy is from week 1 until the end of week 12 (months 1 through 3). A week after a sperm fertilizes an egg, the egg will implant on the wall of the uterus. This embryo will begin to develop into a baby. Genes from you and your partner are forming the baby. The female genes determine whether the baby is a boy or a girl. At 6-8 weeks, the eyes and face are formed, and the heartbeat can be seen on ultrasound. At the end of 12 weeks, all the baby's organs  are formed.  Now that you are pregnant, you will want to do everything you can to have a healthy baby. Two of the most important things are to get good prenatal care and to follow your health care provider's instructions. Prenatal care is all the medical care you receive before the baby's birth. This care will help prevent, find, and treat any problems during the  pregnancy and childbirth. BODY CHANGES Your body goes through many changes during pregnancy. The changes vary from woman to woman.   You may gain or lose a couple of pounds at first.  You may feel sick to your stomach (nauseous) and throw up (vomit). If the vomiting is uncontrollable, call your health care provider.  You may tire easily.  You may develop headaches that can be relieved by medicines approved by your health care provider.  You may urinate more often. Painful urination may mean you have a bladder infection.  You may develop heartburn as a result of your pregnancy.  You may develop constipation because certain hormones are causing the muscles that push waste through your intestines to slow down.  You may develop hemorrhoids or swollen, bulging veins (varicose veins).  Your breasts may begin to grow larger and become tender. Your nipples may stick out more, and the tissue that surrounds them (areola) may become darker.  Your gums may bleed and may be sensitive to brushing and flossing.  Dark spots or blotches (chloasma, mask of pregnancy) may develop on your face. This will likely fade after the baby is born.  Your menstrual periods will stop.  You may have a loss of appetite.  You may develop cravings for certain kinds of food.  You may have changes in your emotions from day to day, such as being excited to be pregnant or being concerned that something may go wrong with the pregnancy and baby.  You may have more vivid and strange dreams.  You may have changes in your hair. These can include thickening of your hair, rapid growth, and changes in texture. Some women also have hair loss during or after pregnancy, or hair that feels dry or thin. Your hair will most likely return to normal after your baby is born. WHAT TO EXPECT AT YOUR PRENATAL VISITS During a routine prenatal visit:  You will be weighed to make sure you and the baby are growing normally.  Your blood  pressure will be taken.  Your abdomen will be measured to track your baby's growth.  The fetal heartbeat will be listened to starting around week 10 or 12 of your pregnancy.  Test results from any previous visits will be discussed. Your health care provider may ask you:  How you are feeling.  If you are feeling the baby move.  If you have had any abnormal symptoms, such as leaking fluid, bleeding, severe headaches, or abdominal cramping.  If you are using any tobacco products, including cigarettes, chewing tobacco, and electronic cigarettes.  If you have any questions. Other tests that may be performed during your first trimester include:  Blood tests to find your blood type and to check for the presence of any previous infections. They will also be used to check for low iron levels (anemia) and Rh antibodies. Later in the pregnancy, blood tests for diabetes will be done along with other tests if problems develop.  Urine tests to check for infections, diabetes, or protein in the urine.  An ultrasound to confirm the proper growth and development of  the baby.  An amniocentesis to check for possible genetic problems.  Fetal screens for spina bifida and Down syndrome.  You may need other tests to make sure you and the baby are doing well.  HIV (human immunodeficiency virus) testing. Routine prenatal testing includes screening for HIV, unless you choose not to have this test. HOME CARE INSTRUCTIONS  Medicines  Follow your health care provider's instructions regarding medicine use. Specific medicines may be either safe or unsafe to take during pregnancy.  Take your prenatal vitamins as directed.  If you develop constipation, try taking a stool softener if your health care provider approves. Diet  Eat regular, well-balanced meals. Choose a variety of foods, such as meat or vegetable-based protein, fish, milk and low-fat dairy products, vegetables, fruits, and whole grain breads  and cereals. Your health care provider will help you determine the amount of weight gain that is right for you.  Avoid raw meat and uncooked cheese. These carry germs that can cause birth defects in the baby.  Eating four or five small meals rather than three large meals a day may help relieve nausea and vomiting. If you start to feel nauseous, eating a few soda crackers can be helpful. Drinking liquids between meals instead of during meals also seems to help nausea and vomiting.  If you develop constipation, eat more high-fiber foods, such as fresh vegetables or fruit and whole grains. Drink enough fluids to keep your urine clear or pale yellow. Activity and Exercise  Exercise only as directed by your health care provider. Exercising will help you:  Control your weight.  Stay in shape.  Be prepared for labor and delivery.  Experiencing pain or cramping in the lower abdomen or low back is a good sign that you should stop exercising. Check with your health care provider before continuing normal exercises.  Try to avoid standing for long periods of time. Move your legs often if you must stand in one place for a long time.  Avoid heavy lifting.  Wear low-heeled shoes, and practice good posture.  You may continue to have sex unless your health care provider directs you otherwise. Relief of Pain or Discomfort  Wear a good support bra for breast tenderness.   Take warm sitz baths to soothe any pain or discomfort caused by hemorrhoids. Use hemorrhoid cream if your health care provider approves.   Rest with your legs elevated if you have leg cramps or low back pain.  If you develop varicose veins in your legs, wear support hose. Elevate your feet for 15 minutes, 3-4 times a day. Limit salt in your diet. Prenatal Care  Schedule your prenatal visits by the twelfth week of pregnancy. They are usually scheduled monthly at first, then more often in the last 2 months before  delivery.  Write down your questions. Take them to your prenatal visits.  Keep all your prenatal visits as directed by your health care provider. Safety  Wear your seat belt at all times when driving.  Make a list of emergency phone numbers, including numbers for family, friends, the hospital, and police and fire departments. General Tips  Ask your health care provider for a referral to a local prenatal education class. Begin classes no later than at the beginning of month 6 of your pregnancy.  Ask for help if you have counseling or nutritional needs during pregnancy. Your health care provider can offer advice or refer you to specialists for help with various needs.  Do not use  hot tubs, steam rooms, or saunas.  Do not douche or use tampons or scented sanitary pads.  Do not cross your legs for long periods of time.  Avoid cat litter boxes and soil used by cats. These carry germs that can cause birth defects in the baby and possibly loss of the fetus by miscarriage or stillbirth.  Avoid all smoking, herbs, alcohol, and medicines not prescribed by your health care provider. Chemicals in these affect the formation and growth of the baby.  Do not use any tobacco products, including cigarettes, chewing tobacco, and electronic cigarettes. If you need help quitting, ask your health care provider. You may receive counseling support and other resources to help you quit.  Schedule a dentist appointment. At home, brush your teeth with a soft toothbrush and be gentle when you floss. SEEK MEDICAL CARE IF:   You have dizziness.  You have mild pelvic cramps, pelvic pressure, or nagging pain in the abdominal area.  You have persistent nausea, vomiting, or diarrhea.  You have a bad smelling vaginal discharge.  You have pain with urination.  You notice increased swelling in your face, hands, legs, or ankles. SEEK IMMEDIATE MEDICAL CARE IF:   You have a fever.  You are leaking fluid from  your vagina.  You have spotting or bleeding from your vagina.  You have severe abdominal cramping or pain.  You have rapid weight gain or loss.  You vomit blood or material that looks like coffee grounds.  You are exposed to [place] measles and have never had them.  You are exposed to fifth disease or chickenpox.  You develop a severe headache.  You have shortness of breath.  You have any kind of trauma, such as from a fall or a car accident.   This information is not intended to replace advice given to you by your health care provider. Make sure you discuss any questions you have with your health care provider.   Document Released: 09/13/2001 Document Revised: 10/10/2014 Document Reviewed: 07/30/2013 Elsevier Interactive Patient Education [employer].

## 2015-10-08 NOTE — MAU Note (Signed)
PT SAYS  SHE  HAS  BAD PAIN  IN LOWER   ABD - THAT  STARTED    LAST  Thursday   .       TOOK HPT-   ON     12-26- POSITIVE.     TOOK MOTRIN  200MG   ON Tuesday   -    SLIGHT  RELIEF.    NO VAG   BLEEDING.    NO BIRTH  CONTROL.     LAST  SEX-  Monday.

## 2015-10-08 NOTE — MAU Provider Note (Signed)
History     CSN: [id number]  Arrival date and time: 10/08/15 1944   First Provider Initiated Contact with Patient 10/08/15 2128      Chief Complaint  Patient presents with  . Abdominal Pain   HPI Comments: Rhonda Pierce is a 25 y.o. G2P0010 at [redacted]w[redacted]d who presents today with lower abdominal pain. She denies any vaginal bleeding. She states that her LMP: 08/26/15 (approx). She is not using anything for contraception.   Abdominal Pain This is a new problem. The current episode started 1 to 4 weeks ago. The onset quality is gradual. The problem occurs intermittently. The problem has been unchanged. The pain is located in the suprapubic region. The pain is at a severity of 7/10. The quality of the pain is cramping. The abdominal pain does not radiate. Associated symptoms include nausea and vomiting. Pertinent negatives include no constipation, diarrhea, dysuria, fever or frequency. Nothing aggravates the pain. The pain is relieved by nothing. Treatments tried: Motrin  The treatment provided no relief.   History reviewed. No pertinent past medical history.  History reviewed. No pertinent past surgical history.  Family History  Problem Relation Age of Onset  . Cancer Mother     Social History  Substance Use Topics  . Smoking status: Current Some Day Smoker    Types: Cigarettes  . Smokeless tobacco: None  . Alcohol Use: No    Allergies: No Known Allergies  Prescriptions prior to admission  Medication Sig Dispense Refill Last Dose  . penicillin v potassium (VEETID) 500 MG tablet Take 1 tablet (500 mg total) by mouth 4 (four) times daily. 56 tablet 0   . predniSONE (DELTASONE) 10 MG tablet 6-5-4-3-2-1 tabs po qd 21 tablet 0     Review of Systems  Constitutional: Negative for fever and chills.  Gastrointestinal: Positive for nausea, vomiting and abdominal pain. Negative for diarrhea and constipation.  Genitourinary: Negative for dysuria, urgency and frequency.   Physical Exam    Blood pressure 115/67, pulse 72, temperature 98.3 F (36.8 C), temperature source Oral, height 5\' 2"  (1.575 m), weight 75.864 kg (167 lb 4 oz), last menstrual period 08/26/2015.  Physical Exam  Nursing note and vitals reviewed. Constitutional: She is oriented to person, place, and time. She appears well-developed and well-nourished. No distress.  HENT:  Head: Normocephalic.  Cardiovascular: Normal rate.   Respiratory: Effort normal.  GI: Soft. There is no tenderness. There is no rebound.  Genitourinary:   External: no lesion Vagina: small amount of white discharge Cervix: pink, smooth, no CMT Uterus: NSSC Adnexa: NT   Neurological: She is alert and oriented to person, place, and time.  Skin: Skin is warm and dry.  Psychiatric: She has a normal mood and affect.    Results for orders placed or performed during the hospital encounter of 10/08/15 (from the past 24 hour(s))  Urinalysis, Routine w reflex microscopic (not at [facility])     Status: Abnormal   Collection Time: 10/08/15  8:07 PM  Result Value Ref Range   Color, Urine YELLOW YELLOW   APPearance CLEAR CLEAR   Specific Gravity, Urine 1.020 1.005 - 1.030   pH 6.0 5.0 - 8.0   Glucose, UA NEGATIVE NEGATIVE mg/dL   Hgb urine dipstick NEGATIVE NEGATIVE   Bilirubin Urine NEGATIVE NEGATIVE   Ketones, ur 15 (A) NEGATIVE mg/dL   Protein, ur NEGATIVE NEGATIVE mg/dL   Nitrite NEGATIVE NEGATIVE   Leukocytes, UA NEGATIVE NEGATIVE  CBC     Status: Abnormal   Collection  Time: 10/08/15  8:46 PM  Result Value Ref Range   WBC 10.9 (H) 4.0 - 10.5 K/uL   RBC 4.93 3.87 - 5.11 MIL/uL   Hemoglobin 11.8 (L) 12.0 - 15.0 g/dL   HCT 35.4 (L) 36.0 - 46.0 %   MCV 71.8 (L) 78.0 - 100.0 fL   MCH 23.9 (L) 26.0 - 34.0 pg   MCHC 33.3 30.0 - 36.0 g/dL   RDW 14.7 11.5 - 15.5 %   Platelets 274 150 - 400 K/uL  ABO/Rh     Status: None (Preliminary result)   Collection Time: 10/08/15  8:51 PM  Result Value Ref Range   ABO/RH(D) O POS   hCG,  quantitative, pregnancy     Status: Abnormal   Collection Time: 10/08/15  8:51 PM  Result Value Ref Range   hCG, Beta Chain, Quant, S 3699 (H) <5 mIU/mL  Wet prep, genital     Status: Abnormal   Collection Time: 10/08/15  9:45 PM  Result Value Ref Range   Yeast Wet Prep HPF POC NONE SEEN NONE SEEN   Trich, Wet Prep NONE SEEN NONE SEEN   Clue Cells Wet Prep HPF POC NONE SEEN NONE SEEN   WBC, Wet Prep HPF POC MODERATE (A) NONE SEEN   Sperm NONE SEEN    US Ob Comp Less 14 Wks  10/08/2015  CLINICAL DATA:  Acute onset of generalized abdominal pain. Initial encounter. EXAM: OBSTETRIC <14 WK [place] AND TRANSVAGINAL OB US TECHNIQUE: Both transabdominal and transvaginal ultrasound examinations were performed for complete evaluation of the gestation as well as the maternal uterus, adnexal regions, and pelvic cul-de-sac. Transvaginal technique was performed to assess early pregnancy. COMPARISON:  None. FINDINGS: Intrauterine gestational sac: Visualized/normal in shape. Yolk sac:  Yes, though not well characterized. Embryo:  No Cardiac Activity: N/A MSD: 6.0  mm   5 w   2  d Subchorionic hemorrhage:  No subchorionic hemorrhage is noted. Maternal uterus/adnexae: The uterus is unremarkable in appearance. The ovaries are unremarkable. The right ovary measures 3.8 x 3.0 x 2.8 cm, while the left ovary measures 2.5 x 2.2 x 1.9 cm. No suspicious adnexal masses are seen; there is no evidence for ovarian torsion. No free fluid is seen within the pelvic cul-de-sac. IMPRESSION: Single intrauterine gestational sac noted, with a mean sac diameter of 6 mm, corresponding to a gestational age of [redacted] weeks 2 days. This matches the gestational age of [redacted] weeks 1 day by LMP, reflecting an estimated date of delivery of June 01, 2016. A yolk sac is visualized. No embryo is yet seen. Electronically Signed   By: Garald Balding M.D.   On: 10/08/2015 23:17   US Ob Transvaginal  10/08/2015  CLINICAL DATA:  Acute onset of generalized abdominal  pain. Initial encounter. EXAM: OBSTETRIC <14 WK [place] AND TRANSVAGINAL OB US TECHNIQUE: Both transabdominal and transvaginal ultrasound examinations were performed for complete evaluation of the gestation as well as the maternal uterus, adnexal regions, and pelvic cul-de-sac. Transvaginal technique was performed to assess early pregnancy. COMPARISON:  None. FINDINGS: Intrauterine gestational sac: Visualized/normal in shape. Yolk sac:  Yes, though not well characterized. Embryo:  No Cardiac Activity: N/A MSD: 6.0  mm   5 w   2  d Subchorionic hemorrhage:  No subchorionic hemorrhage is noted. Maternal uterus/adnexae: The uterus is unremarkable in appearance. The ovaries are unremarkable. The right ovary measures 3.8 x 3.0 x 2.8 cm, while the left ovary measures 2.5 x 2.2 x 1.9 cm. No  suspicious adnexal masses are seen; there is no evidence for ovarian torsion. No free fluid is seen within the pelvic cul-de-sac. IMPRESSION: Single intrauterine gestational sac noted, with a mean sac diameter of 6 mm, corresponding to a gestational age of [redacted] weeks 2 days. This matches the gestational age of [redacted] weeks 1 day by LMP, reflecting an estimated date of delivery of June 01, 2016. A yolk sac is visualized. No embryo is yet seen. Electronically Signed   By: Garald Balding M.D.   On: 10/08/2015 23:17    MAU Course  Procedures  MDM   Assessment and Plan   1. Pelvic pain affecting pregnancy in first trimester, antepartum    DC home Comfort measures reviewed  1st Trimester precautions  Bleeding precautions RX: phenergan PRN nausea  Return to MAU as needed FU with OB as planned  Follow-up Information    Schedule an appointment as soon as possible for a visit with [facility].   Contact information:   [address] [place] [phone]       \  Mathis Bud 10/08/2015, 9:31 PM

## 2015-10-09 LAB — RPR: RPR: NONREACTIVE

## 2015-10-09 LAB — GC/CHLAMYDIA PROBE AMP (~~LOC~~) NOT AT ARMC
Chlamydia: NEGATIVE
Neisseria Gonorrhea: NEGATIVE

## 2015-10-09 LAB — POCT PREGNANCY, URINE: Preg Test, Ur: POSITIVE — AB

## 2015-10-09 LAB — HIV ANTIBODY (ROUTINE TESTING W REFLEX): HIV Screen 4th Generation wRfx: NONREACTIVE

## 2015-10-09 LAB — ABO/RH: ABO/RH(D): O POS

## 2015-11-02 LAB — OB RESULTS CONSOLE HIV ANTIBODY (ROUTINE TESTING): HIV: NONREACTIVE

## 2015-11-02 LAB — OB RESULTS CONSOLE ABO/RH: RH TYPE: POSITIVE

## 2015-11-02 LAB — OB RESULTS CONSOLE RPR: RPR: NONREACTIVE

## 2015-11-02 LAB — OB RESULTS CONSOLE GC/CHLAMYDIA
CHLAMYDIA, DNA PROBE: NEGATIVE
Gonorrhea: NEGATIVE

## 2015-11-02 LAB — OB RESULTS CONSOLE ANTIBODY SCREEN: Antibody Screen: NEGATIVE

## 2015-11-02 LAB — OB RESULTS CONSOLE HEPATITIS B SURFACE ANTIGEN: HEP B S AG: NEGATIVE

## 2015-11-02 LAB — OB RESULTS CONSOLE RUBELLA ANTIBODY, IGM: Rubella: IMMUNE

## 2015-11-13 LAB — HM PAP SMEAR: HM Pap smear: NEGATIVE

## 2016-01-21 ENCOUNTER — Other Ambulatory Visit (HOSPITAL_COMMUNITY): Payer: Self-pay | Admitting: Obstetrics and Gynecology

## 2016-01-21 DIAGNOSIS — Z3A21 21 weeks gestation of pregnancy: Secondary | ICD-10-CM

## 2016-01-21 DIAGNOSIS — Z3689 Encounter for other specified antenatal screening: Secondary | ICD-10-CM

## 2016-01-22 ENCOUNTER — Other Ambulatory Visit (HOSPITAL_COMMUNITY): Payer: Self-pay | Admitting: Obstetrics and Gynecology

## 2016-01-22 ENCOUNTER — Ambulatory Visit (HOSPITAL_COMMUNITY)
Admission: RE | Admit: 2016-01-22 | Discharge: 2016-01-22 | Disposition: A | Discharge status: Home / Self Care | Payer: Medicaid Other | Source: Ambulatory Visit | Attending: Obstetrics and Gynecology | Admitting: Obstetrics and Gynecology

## 2016-01-22 ENCOUNTER — Encounter (HOSPITAL_COMMUNITY): Payer: Self-pay

## 2016-01-22 DIAGNOSIS — Z3689 Encounter for other specified antenatal screening: Secondary | ICD-10-CM

## 2016-01-22 DIAGNOSIS — O283 Abnormal ultrasonic finding on antenatal screening of mother: Secondary | ICD-10-CM

## 2016-01-22 DIAGNOSIS — O289 Unspecified abnormal findings on antenatal screening of mother: Secondary | ICD-10-CM | POA: Insufficient documentation

## 2016-01-22 DIAGNOSIS — Z3A21 21 weeks gestation of pregnancy: Secondary | ICD-10-CM

## 2016-01-22 DIAGNOSIS — Z36 Encounter for antenatal screening of mother: Secondary | ICD-10-CM | POA: Diagnosis present

## 2016-01-22 HISTORY — DX: Other specified health status: Z78.9

## 2016-03-10 ENCOUNTER — Encounter (HOSPITAL_COMMUNITY): Payer: Self-pay

## 2016-03-10 ENCOUNTER — Inpatient Hospital Stay (HOSPITAL_COMMUNITY)
Admission: AD | Admit: 2016-03-10 | Discharge: 2016-03-10 | Disposition: A | Discharge status: Home / Self Care | Payer: Medicaid Other | Source: Ambulatory Visit | Attending: Obstetrics & Gynecology | Admitting: Obstetrics & Gynecology

## 2016-03-10 DIAGNOSIS — O9989 Other specified diseases and conditions complicating pregnancy, childbirth and the puerperium: Secondary | ICD-10-CM

## 2016-03-10 DIAGNOSIS — O285 Abnormal chromosomal and genetic finding on antenatal screening of mother: Secondary | ICD-10-CM

## 2016-03-10 DIAGNOSIS — M549 Dorsalgia, unspecified: Secondary | ICD-10-CM

## 2016-03-10 DIAGNOSIS — B9689 Other specified bacterial agents as the cause of diseases classified elsewhere: Secondary | ICD-10-CM | POA: Diagnosis not present

## 2016-03-10 DIAGNOSIS — Z3A27 27 weeks gestation of pregnancy: Secondary | ICD-10-CM | POA: Diagnosis not present

## 2016-03-10 DIAGNOSIS — N760 Acute vaginitis: Secondary | ICD-10-CM | POA: Insufficient documentation

## 2016-03-10 DIAGNOSIS — O99012 Anemia complicating pregnancy, second trimester: Secondary | ICD-10-CM | POA: Insufficient documentation

## 2016-03-10 DIAGNOSIS — O23592 Infection of other part of genital tract in pregnancy, second trimester: Secondary | ICD-10-CM | POA: Insufficient documentation

## 2016-03-10 DIAGNOSIS — O99019 Anemia complicating pregnancy, unspecified trimester: Secondary | ICD-10-CM

## 2016-03-10 DIAGNOSIS — R1030 Lower abdominal pain, unspecified: Secondary | ICD-10-CM

## 2016-03-10 DIAGNOSIS — M545 Low back pain: Secondary | ICD-10-CM | POA: Diagnosis present

## 2016-03-10 DIAGNOSIS — R109 Unspecified abdominal pain: Secondary | ICD-10-CM | POA: Diagnosis present

## 2016-03-10 DIAGNOSIS — Z87891 Personal history of nicotine dependence: Secondary | ICD-10-CM | POA: Insufficient documentation

## 2016-03-10 LAB — WET PREP, GENITAL
SPERM: NONE SEEN
TRICH WET PREP: NONE SEEN
YEAST WET PREP: NONE SEEN

## 2016-03-10 LAB — URINE MICROSCOPIC-ADD ON

## 2016-03-10 LAB — FETAL FIBRONECTIN: Fetal Fibronectin: NEGATIVE

## 2016-03-10 LAB — URINALYSIS, ROUTINE W REFLEX MICROSCOPIC
BILIRUBIN URINE: NEGATIVE
Glucose, UA: NEGATIVE mg/dL
HGB URINE DIPSTICK: NEGATIVE
Ketones, ur: NEGATIVE mg/dL
NITRITE: NEGATIVE
PH: 5.5 (ref 5.0–8.0)
Protein, ur: NEGATIVE mg/dL
SPECIFIC GRAVITY, URINE: 1.01 (ref 1.005–1.030)

## 2016-03-10 MED ORDER — IBUPROFEN 600 MG PO TABS
600.0000 mg | ORAL_TABLET | Freq: Once | ORAL | Status: AC
  Administered 2016-03-10: 600 mg via ORAL
  Filled 2016-03-10: qty 1

## 2016-03-10 MED ORDER — METRONIDAZOLE 500 MG PO TABS: 500.0000 mg | ORAL_TABLET | Freq: Two times a day (BID) | ORAL | Status: DC

## 2016-03-10 NOTE — MAU Provider Note (Signed)
History  Rhonda Pierce is a 25 yo G2P0010 @ 27.[redacted] wks EGA who presents unannounced and unaccompanied to MAU w/ c/o pain in groin and lower back x 4 days. Feeling lots of pressure and "it hurts to sit." Reports active fetus. Denies VB, ctxs or LOF. Last took Tylenol 1000 mg (ES) at 02:30 this morning with some relief.  Of note, pt's 1hr GTT in office was 135 - informed needs 3hr GTT ASAP.  Concerned that work is causing her discomforts.  Patient Active Problem List   Diagnosis Date Noted  . 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 Anemia affecting pregnancy 03/10/2016    Chief Complaint  Patient presents with  . Abdominal Pain   HPI  As above  OB History    Gravida Para Term Preterm AB TAB SAB Ectopic Multiple Living   2    1 1     0      Past Medical History  Diagnosis Date  . Medical history non-contributory     Past Surgical History  Procedure Laterality Date  . No past surgeries      Family History  Problem Relation Age of Onset  . Cancer Mother     Social History  Substance Use Topics  . Smoking status: Former Smoker    Types: Cigarettes  . Smokeless tobacco: None  . Alcohol Use: No    Allergies: No Known Allergies  Prescriptions prior to admission  Medication Sig Dispense Refill Last Dose  . penicillin v potassium (VEETID) 500 MG tablet Take 1 tablet (500 mg total) by mouth 4 (four) times daily. (Patient not taking: Reported on 01/22/2016) 56 tablet 0 Not Taking  . predniSONE (DELTASONE) 10 MG tablet 6-5-4-3-2-1 tabs po qd (Patient not taking: Reported on 01/22/2016) 21 tablet 0 Not Taking  . Prenatal Vit-Fe Fumarate-FA (PRENATAL VITAMIN PO) Take by mouth.   Taking  . promethazine (PHENERGAN) 25 MG tablet Take 0.5-1 tablets (12.5-25 mg total) by mouth every 6 (six) hours as needed. (Patient not taking: Reported on 01/22/2016) 30 tablet 0 Not Taking    ROS  As per HPI Physical Exam   Blood pressure 113/66, pulse 83, temperature 98.3  F (36.8 C), resp. rate 18, height 5\' 3"  (1.6 m), weight 78.472 kg (173 lb), last menstrual period 08/28/2015.    Physical Exam  Gen: Anxious. Back: Neg CVAT bilaterally. Abdomen: soft, NT, no rebound or guarding. External genitalia: No external erythema, edema or excoriation. Vagina: No lesions. No blood in vault. SSE: mod amount of white, thick discharge; whiff test neg. Spotting w/ GC/CT collection -- no active bleeding.  Pelvic: Long/thick/closed/soft  Ext: WNL. Doptones: BL 130 w/ moderate variability, +accels, no decels No ctxs per toco or palpation.  ED Course  UA, fetal fibronectin, GC/CT, wet prep  Assessment: Pain Cat 1 FHRT  Plan: Ibuprofen 600 mg tab x 1 now   Farrel Gordon CNM, MS 03/10/2016 8:16 PM   Results for orders placed or performed during the hospital encounter of 03/10/16 (from the past 24 hour(s))  Urinalysis, Routine w reflex microscopic (not [facility])     Status: Abnormal   Collection Time: 03/10/16  6:20 PM  Result Value Ref Range   Color, Urine YELLOW YELLOW   APPearance CLEAR CLEAR   Specific Gravity, Urine 1.010 1.005 - 1.030   pH 5.5 5.0 - 8.0   Glucose, UA NEGATIVE NEGATIVE mg/dL   Hgb urine dipstick NEGATIVE NEGATIVE   Bilirubin Urine NEGATIVE NEGATIVE  Ketones, ur NEGATIVE NEGATIVE mg/dL   Protein, ur NEGATIVE NEGATIVE mg/dL   Nitrite NEGATIVE NEGATIVE   Leukocytes, UA TRACE (A) NEGATIVE  Urine microscopic-add on     Status: Abnormal   Collection Time: 03/10/16  6:20 PM  Result Value Ref Range   Squamous Epithelial / LPF 0-5 (A) NONE SEEN   WBC, UA 0-5 0 - 5 WBC/hpf   RBC / HPF 0-5 0 - 5 RBC/hpf   Bacteria, UA RARE (A) NONE SEEN  Wet prep, genital     Status: Abnormal   Collection Time: 03/10/16  8:01 PM  Result Value Ref Range   Yeast Wet Prep HPF POC NONE SEEN NONE SEEN   Trich, Wet Prep NONE SEEN NONE SEEN   Clue Cells Wet Prep HPF POC PRESENT (A) NONE SEEN   WBC, Wet Prep HPF POC MODERATE (A) NONE SEEN   Sperm NONE  SEEN   Fetal fibronectin     Status: None   Collection Time: 03/10/16  8:01 PM  Result Value Ref Range   Fetal Fibronectin NEGATIVE NEGATIVE    Addendum:  A: BV Elevated 1 hr at office (135) on 02/26/16  P: Dispo to home w/ script for Metronidazole 7-day course. 3hr GTT ASAP - office will schedule. Advised that the 1hr is a screening test and does not diagnose GDM, but the 3hr GTT would. Explained that currently she is at risk for GDM and she has a high chance of developing Type 2 diabetes later in life. Explained that 2 elevated values would make the diagnosis, or if fasting value is 200 or more.  Explained adverse outcomes if in fact GDM, i.e., preeclampsia, hydramnios, macrosomia/LGA, fetal organomegaly, maternal and infant birth trauma, operative delivery, perinatal morbidity, neonatal respiratory problems and metabolic complications.  Strict PTL precautions. Advised no Ibuprofen after today. Advised to rest as much as possible, reduce stress and f/u in office as scheduled, sooner if indicated. PTL precautions. Informed to take medication as prescribed. Pelvic rest until completion of medication. Explained no need to be out of work at this time. Common discomforts of pregnancy reviewed. Pt asked several times the same questions related to pregnancy. Paraphrased answers to pt's satisfaction.   Farrel Gordon, CNM 03/10/16, 9:10 PM

## 2016-03-10 NOTE — MAU Note (Signed)
Pt reprots having lower abd [pain and cramping for the past few days. Also c/o lower back pain.

## 2016-03-10 NOTE — Discharge Instructions (Signed)

## 2016-03-11 LAB — GC/CHLAMYDIA PROBE AMP (~~LOC~~) NOT AT ARMC
Chlamydia: NEGATIVE
NEISSERIA GONORRHEA: NEGATIVE

## 2016-04-08 ENCOUNTER — Inpatient Hospital Stay (HOSPITAL_COMMUNITY)
Admission: AD | Admit: 2016-04-08 | Discharge: 2016-04-09 | Disposition: A | Discharge status: Home / Self Care | Payer: Medicaid Other | Source: Ambulatory Visit | Attending: Obstetrics and Gynecology | Admitting: Obstetrics and Gynecology

## 2016-04-08 ENCOUNTER — Encounter (HOSPITAL_COMMUNITY): Payer: Self-pay | Admitting: *Deleted

## 2016-04-08 DIAGNOSIS — O26899 Other specified pregnancy related conditions, unspecified trimester: Secondary | ICD-10-CM

## 2016-04-08 DIAGNOSIS — O99013 Anemia complicating pregnancy, third trimester: Secondary | ICD-10-CM | POA: Insufficient documentation

## 2016-04-08 DIAGNOSIS — Z87891 Personal history of nicotine dependence: Secondary | ICD-10-CM | POA: Insufficient documentation

## 2016-04-08 DIAGNOSIS — N760 Acute vaginitis: Secondary | ICD-10-CM | POA: Diagnosis not present

## 2016-04-08 DIAGNOSIS — R109 Unspecified abdominal pain: Secondary | ICD-10-CM

## 2016-04-08 DIAGNOSIS — O26893 Other specified pregnancy related conditions, third trimester: Secondary | ICD-10-CM | POA: Diagnosis not present

## 2016-04-08 DIAGNOSIS — B9689 Other specified bacterial agents as the cause of diseases classified elsewhere: Secondary | ICD-10-CM

## 2016-04-08 DIAGNOSIS — W19XXXA Unspecified fall, initial encounter: Secondary | ICD-10-CM

## 2016-04-08 DIAGNOSIS — Z3A32 32 weeks gestation of pregnancy: Secondary | ICD-10-CM | POA: Diagnosis not present

## 2016-04-08 DIAGNOSIS — W1830XA Fall on same level, unspecified, initial encounter: Secondary | ICD-10-CM | POA: Diagnosis not present

## 2016-04-08 LAB — URINALYSIS, ROUTINE W REFLEX MICROSCOPIC
Bilirubin Urine: NEGATIVE
Glucose, UA: NEGATIVE mg/dL
Hgb urine dipstick: NEGATIVE
KETONES UR: NEGATIVE mg/dL
NITRITE: NEGATIVE
PH: 6.5 (ref 5.0–8.0)
Protein, ur: NEGATIVE mg/dL
SPECIFIC GRAVITY, URINE: 1.015 (ref 1.005–1.030)

## 2016-04-08 LAB — URINE MICROSCOPIC-ADD ON

## 2016-04-08 LAB — WET PREP, GENITAL
Sperm: NONE SEEN
TRICH WET PREP: NONE SEEN
Yeast Wet Prep HPF POC: NONE SEEN

## 2016-04-08 MED ORDER — ACETAMINOPHEN 500 MG PO TABS
1000.0000 mg | ORAL_TABLET | Freq: Once | ORAL | Status: AC
  Administered 2016-04-08: 1000 mg via ORAL
  Filled 2016-04-08: qty 2

## 2016-04-08 NOTE — MAU Note (Signed)
About 1630 I fell outside backward on my butt. Having some cramps in lower abd and pain around vagina. Denies LOF or bleeding. Have felt good FM since fall

## 2016-04-09 MED ORDER — METRONIDAZOLE 500 MG PO TABS: 500.0000 mg | ORAL_TABLET | Freq: Two times a day (BID) | ORAL | Status: DC

## 2016-04-09 MED ORDER — METRONIDAZOLE 500 MG PO TABS
500.0000 mg | ORAL_TABLET | Freq: Once | ORAL | Status: AC
  Administered 2016-04-09: 500 mg via ORAL
  Filled 2016-04-09: qty 1

## 2016-04-09 MED ORDER — NIFEDIPINE 10 MG PO CAPS
10.0000 mg | ORAL_CAPSULE | Freq: Once | ORAL | Status: AC
  Administered 2016-04-09: 10 mg via ORAL
  Filled 2016-04-09: qty 1

## 2016-04-09 NOTE — Discharge Instructions (Signed)
Braxton Hicks Contractions °Contractions of the uterus can occur throughout pregnancy. Contractions are not always a sign that you are in labor.  °WHAT ARE BRAXTON HICKS CONTRACTIONS?  °Contractions that occur before labor are called Braxton Hicks contractions, or false labor. Toward the end of pregnancy (32-34 weeks), these contractions can develop more often and may become more forceful. This is not true labor because these contractions do not result in opening (dilatation) and thinning of the cervix. They are sometimes difficult to tell apart from true labor because these contractions can be forceful and people have different pain tolerances. You should not feel embarrassed if you go to the hospital with false labor. Sometimes, the only way to tell if you are in true labor is for your health care provider to look for changes in the cervix. °If there are no prenatal problems or other health problems associated with the pregnancy, it is completely safe to be sent home with false labor and await the onset of true labor. °HOW CAN YOU TELL THE DIFFERENCE BETWEEN TRUE AND FALSE LABOR? °False Labor °· The contractions of false labor are usually shorter and not as hard as those of true labor.   °· The contractions are usually irregular.   °· The contractions are often felt in the front of the lower abdomen and in the groin.   °· The contractions may go away when you walk around or change positions while lying down.   °· The contractions get weaker and are shorter lasting as time goes on.   °· The contractions do not usually become progressively stronger, regular, and closer together as with true labor.   °True Labor °· Contractions in true labor last 30-70 seconds, become very regular, usually become more intense, and increase in frequency.   °· The contractions do not go away with walking.   °· The discomfort is usually felt in the top of the uterus and spreads to the lower abdomen and low back.   °· True labor can be  determined by your health care provider with an exam. This will show that the cervix is dilating and getting thinner.   °WHAT TO REMEMBER °· Keep up with your usual exercises and follow other instructions given by your health care provider.   °· Take medicines as directed by your health care provider.   °· Keep your regular prenatal appointments.   °· Eat and drink lightly if you think you are going into labor.   °· If Braxton Hicks contractions are making you uncomfortable:   °¨ Change your position from lying down or resting to walking, or from walking to resting.   °¨ Sit and rest in a tub of warm water.   °¨ Drink 2-3 glasses of water. Dehydration may cause these contractions.   °¨ Do slow and deep breathing several times an hour.   °WHEN SHOULD I SEEK IMMEDIATE MEDICAL CARE? °Seek immediate medical care if: °· Your contractions become stronger, more regular, and closer together.   °· You have fluid leaking or gushing from your vagina.   °· You have a fever.   °· You pass blood-tinged mucus.   °· You have vaginal bleeding.   °· You have continuous abdominal pain.   °· You have low back pain that you never had before.   °· You feel your baby's head pushing down and causing pelvic pressure.   °· Your baby is not moving as much as it used to.   °  °This information is not intended to replace advice given to you by your health care provider. Make sure you discuss any questions you have with your health care   provider.   Document Released: 09/19/2005 Document Revised: 09/24/2013 Document Reviewed: 07/01/2013 Elsevier Interactive Patient Education 2016 Else[employer] Bacterial Vaginosis Bacterial vaginosis is an infection of the vagina. It happens when too many germs (bacteria) grow in the vagina. Having this infection puts you at risk for getting other infections from sex. Treating this infection can help lower your risk for other infections, such as:   Chlamydia.  Gonorrhea.  HIV.  Herpes. HOME  CARE  Take your medicine as told by your doctor.  Finish your medicine even if you start to feel better.  Tell your sex partner that you have an infection. They should see their doctor for treatment.  During treatment:  Avoid sex or use condoms correctly.  Do not douche.  Do not drink alcohol unless your doctor tells you it is ok.  Do not breastfeed unless your doctor tells you it is ok. GET HELP IF:  You are not getting better after 3 days of treatment.  You have more grey fluid (discharge) coming from your vagina than before.  You have more pain than before.  You have a fever. MAKE SURE YOU:   Understand these instructions.  Will watch your condition.  Will get help right away if you are not doing well or get worse.   This information is not intended to replace advice given to you by your health care provider. Make sure you discuss any questions you have with your health care provider.   Document Released: 06/28/2008 Document Revised: 10/10/2014 Document Reviewed: 05/01/2013 Elsevier Interactive Patient Education [employer].

## 2016-04-09 NOTE — MAU Provider Note (Signed)
History  Rhonda Pierce is a 25 yo female, G2P0010 @ 32.0 wks who presents announced to MAU w/ c/o fall on buttocks at 16:30 yesterday. No abdominal contact, but +cramping and pain around vagina. Denies vaginal odor, itching or burning. +intercourse in the past 48 hrs.   Patient Active Problem List   Diagnosis Date Noted  . Fall 04/08/2016  . BV (bacterial vaginosis) 04/08/2016  . 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 Anemia affecting pregnancy 03/10/2016    Chief Complaint  Patient presents with  . Fall   HPI  As above  OB History    Gravida Para Term Preterm AB TAB SAB Ectopic Multiple Living   2    1 1     0      Past Medical History  Diagnosis Date  . Medical history non-contributory     Past Surgical History  Procedure Laterality Date  . No past surgeries      Family History  Problem Relation Age of Onset  . Cancer Mother     Social History  Substance Use Topics  . Smoking status: Former Smoker    Types: Cigarettes  . Smokeless tobacco: None  . Alcohol Use: No    Allergies: No Known Allergies  No prescriptions prior to admission    ROS  As per HPI Physical Exam   Results for orders placed or performed during the hospital encounter of 04/08/16 (from the past 72 hour(s))  Urinalysis, Routine w reflex microscopic (not at [facility])     Status: Abnormal   Collection Time: 04/08/16 10:55 PM  Result Value Ref Range   Color, Urine YELLOW YELLOW   APPearance CLEAR CLEAR   Specific Gravity, Urine 1.015 1.005 - 1.030   pH 6.5 5.0 - 8.0   Glucose, UA NEGATIVE NEGATIVE mg/dL   Hgb urine dipstick NEGATIVE NEGATIVE   Bilirubin Urine NEGATIVE NEGATIVE   Ketones, ur NEGATIVE NEGATIVE mg/dL   Protein, ur NEGATIVE NEGATIVE mg/dL   Nitrite NEGATIVE NEGATIVE   Leukocytes, UA SMALL (A) NEGATIVE  Urine microscopic-add on     Status: Abnormal   Collection Time: 04/08/16 10:55 PM  Result Value Ref Range   Squamous Epithelial / LPF 0-5 (A) NONE  SEEN   WBC, UA 0-5 0 - 5 WBC/hpf   RBC / HPF 0-5 0 - 5 RBC/hpf   Bacteria, UA FEW (A) NONE SEEN  Wet prep, genital     Status: Abnormal   Collection Time: 04/08/16 11:35 PM  Result Value Ref Range   Yeast Wet Prep HPF POC NONE SEEN NONE SEEN   Trich, Wet Prep NONE SEEN NONE SEEN   Clue Cells Wet Prep HPF POC PRESENT (A) NONE SEEN   WBC, Wet Prep HPF POC MODERATE (A) NONE SEEN    Comment: MODERATE BACTERIA SEEN   Sperm NONE SEEN    Blood pressure 103/63, pulse 76, temperature 97.7 F (36.5 C), resp. rate 18, height 5' 2.5" (1.588 m), weight 80.65 kg (177 lb 12.8 oz), last menstrual period 08/28/2015.    Physical Exam  Gen: Anxious. Abdomen: soft, NT, no rebound or guarding. NEFG. External genitalia: No external erythema, edema or excoriation. Vagina: No lesions. No blood in vault. SSE: small amount of white, thin discharge; no odor appreciated. Specimen collected for wet prep and GC/CT. No bleeding from cvx.  Pelvic: cvx long/closed on 2 occasions.   Ext: WNL. Doptones: BL 118 w/ moderate variability, +accels, +earlys. +ctxs. fFN deferred due to intercourse in  the past 24-48 hrs.    ED Course  Assessment: PTCs w/o cervical change likely due to BV and poor water intake. Ctxs responded well to Procardia 10 mg x 1 and pain relieved w/ 1 gram of Tylenol and rest while in MAU. Reassuring FHRT.  Plan: D/C home w/ strict PTL precautions. Fall precautions. Advised condoms during intercourse. Metronidazole 500 mg po bid x 7 days. First dose now. Reviewed likely causes of BV -- second dx in one month. Strongly advised increased water intake. FKCs as instructed. OB f/u as scheduled.   Farrel Gordon CNM, MS 04/09/16, 1:40 AM

## 2016-04-11 LAB — GC/CHLAMYDIA PROBE AMP (~~LOC~~) NOT AT ARMC
Chlamydia: NEGATIVE
Neisseria Gonorrhea: NEGATIVE

## 2016-05-13 LAB — OB RESULTS CONSOLE GBS: GBS: NEGATIVE

## 2016-06-01 ENCOUNTER — Telehealth (HOSPITAL_COMMUNITY): Payer: Self-pay | Admitting: *Deleted

## 2016-06-01 ENCOUNTER — Encounter (HOSPITAL_COMMUNITY): Payer: Self-pay | Admitting: *Deleted

## 2016-06-01 NOTE — Telephone Encounter (Signed)
Preadmission screen  

## 2016-06-02 ENCOUNTER — Encounter (HOSPITAL_COMMUNITY): Payer: Self-pay | Admitting: *Deleted

## 2016-06-02 ENCOUNTER — Telehealth (HOSPITAL_COMMUNITY): Payer: Self-pay | Admitting: *Deleted

## 2016-06-02 NOTE — Telephone Encounter (Signed)
Preadmission screen  

## 2016-06-03 ENCOUNTER — Encounter (HOSPITAL_COMMUNITY): Payer: Self-pay | Admitting: *Deleted

## 2016-06-03 ENCOUNTER — Inpatient Hospital Stay (HOSPITAL_COMMUNITY)
Admission: AD | Admit: 2016-06-03 | Discharge: 2016-06-04 | Disposition: A | Discharge status: Home / Self Care | Payer: Medicaid Other | Source: Ambulatory Visit | Attending: Obstetrics & Gynecology | Admitting: Obstetrics & Gynecology

## 2016-06-03 DIAGNOSIS — O471 False labor at or after 37 completed weeks of gestation: Secondary | ICD-10-CM | POA: Insufficient documentation

## 2016-06-03 DIAGNOSIS — Z3A00 Weeks of gestation of pregnancy not specified: Secondary | ICD-10-CM | POA: Insufficient documentation

## 2016-06-03 NOTE — MAU Note (Signed)
Having contractions since 2200. Feeling pelvic pressure. Denies LOF or bleeding. 1cm last sve

## 2016-06-04 DIAGNOSIS — O471 False labor at or after 37 completed weeks of gestation: Secondary | ICD-10-CM | POA: Diagnosis not present

## 2016-06-04 DIAGNOSIS — Z3A00 Weeks of gestation of pregnancy not specified: Secondary | ICD-10-CM | POA: Diagnosis not present

## 2016-06-08 ENCOUNTER — Inpatient Hospital Stay (HOSPITAL_COMMUNITY): Payer: Medicaid Other | Admitting: Anesthesiology

## 2016-06-08 ENCOUNTER — Encounter (HOSPITAL_COMMUNITY): Payer: Self-pay | Admitting: *Deleted

## 2016-06-08 ENCOUNTER — Inpatient Hospital Stay (HOSPITAL_COMMUNITY)
Admission: AD | Admit: 2016-06-08 | Discharge: 2016-06-10 | DRG: 775 | Disposition: A | Discharge status: Home / Self Care | Payer: Medicaid Other | Source: Ambulatory Visit | Attending: Obstetrics and Gynecology | Admitting: Obstetrics and Gynecology

## 2016-06-08 DIAGNOSIS — Z87891 Personal history of nicotine dependence: Secondary | ICD-10-CM

## 2016-06-08 DIAGNOSIS — O9902 Anemia complicating childbirth: Secondary | ICD-10-CM | POA: Diagnosis present

## 2016-06-08 DIAGNOSIS — D649 Anemia, unspecified: Secondary | ICD-10-CM | POA: Diagnosis present

## 2016-06-08 DIAGNOSIS — Z3A40 40 weeks gestation of pregnancy: Secondary | ICD-10-CM

## 2016-06-08 DIAGNOSIS — Z3483 Encounter for supervision of other normal pregnancy, third trimester: Secondary | ICD-10-CM | POA: Diagnosis present

## 2016-06-08 LAB — CBC
HCT: 34.6 % — ABNORMAL LOW (ref 36.0–46.0)
Hemoglobin: 11.9 g/dL — ABNORMAL LOW (ref 12.0–15.0)
MCH: 24.4 pg — ABNORMAL LOW (ref 26.0–34.0)
MCHC: 34.4 g/dL (ref 30.0–36.0)
MCV: 71 fL — ABNORMAL LOW (ref 78.0–100.0)
PLATELETS: 227 10*3/uL (ref 150–400)
RBC: 4.87 MIL/uL (ref 3.87–5.11)
RDW: 14.3 % (ref 11.5–15.5)
WBC: 11.5 10*3/uL — AB (ref 4.0–10.5)

## 2016-06-08 LAB — TYPE AND SCREEN
ABO/RH(D): O POS
ANTIBODY SCREEN: NEGATIVE

## 2016-06-08 LAB — RPR: RPR Ser Ql: NONREACTIVE

## 2016-06-08 MED ORDER — OXYCODONE-ACETAMINOPHEN 5-325 MG PO TABS: 2.0000 | ORAL_TABLET | ORAL | Status: DC | PRN

## 2016-06-08 MED ORDER — DIBUCAINE 1 % RE OINT: 1.0000 "application " | TOPICAL_OINTMENT | RECTAL | Status: DC | PRN

## 2016-06-08 MED ORDER — ACETAMINOPHEN 325 MG PO TABS: 650.0000 mg | ORAL_TABLET | ORAL | Status: DC | PRN

## 2016-06-08 MED ORDER — PHENYLEPHRINE 40 MCG/ML (10ML) SYRINGE FOR IV PUSH (FOR BLOOD PRESSURE SUPPORT): 80.0000 ug | PREFILLED_SYRINGE | INTRAVENOUS | Status: DC | PRN

## 2016-06-08 MED ORDER — DIPHENHYDRAMINE HCL 50 MG/ML IJ SOLN: 12.5000 mg | INTRAMUSCULAR | Status: DC | PRN

## 2016-06-08 MED ORDER — EPHEDRINE 5 MG/ML INJ
10.0000 mg | INTRAVENOUS | Status: DC | PRN
Start: 2016-06-08 — End: 2016-06-08

## 2016-06-08 MED ORDER — IBUPROFEN 600 MG PO TABS
600.0000 mg | ORAL_TABLET | Freq: Four times a day (QID) | ORAL | Status: DC
  Administered 2016-06-09 – 2016-06-10 (×7): 600 mg via ORAL
  Filled 2016-06-08 (×7): qty 1

## 2016-06-08 MED ORDER — FENTANYL 2.5 MCG/ML BUPIVACAINE 1/10 % EPIDURAL INFUSION (WH - ANES)
14.0000 mL/h | INTRAMUSCULAR | Status: DC | PRN
  Administered 2016-06-08: 14 mL/h via EPIDURAL
  Filled 2016-06-08: qty 125

## 2016-06-08 MED ORDER — SOD CITRATE-CITRIC ACID 500-334 MG/5ML PO SOLN
30.0000 mL | ORAL | Status: DC | PRN
Start: 2016-06-08 — End: 2016-06-08

## 2016-06-08 MED ORDER — TETANUS-DIPHTH-ACELL PERTUSSIS 5-2.5-18.5 LF-MCG/0.5 IM SUSP
0.5000 mL | Freq: Once | INTRAMUSCULAR | Status: AC
  Administered 2016-06-09: 0.5 mL via INTRAMUSCULAR
  Filled 2016-06-08: qty 0.5

## 2016-06-08 MED ORDER — TERBUTALINE SULFATE 1 MG/ML IJ SOLN: 0.2500 mg | Freq: Once | INTRAMUSCULAR | Status: DC | PRN

## 2016-06-08 MED ORDER — ONDANSETRON HCL 4 MG PO TABS: 4.0000 mg | ORAL_TABLET | ORAL | Status: DC | PRN

## 2016-06-08 MED ORDER — DIPHENHYDRAMINE HCL 25 MG PO CAPS: 25.0000 mg | ORAL_CAPSULE | Freq: Four times a day (QID) | ORAL | Status: DC | PRN

## 2016-06-08 MED ORDER — BENZOCAINE-MENTHOL 20-0.5 % EX AERO
1.0000 "application " | INHALATION_SPRAY | CUTANEOUS | Status: DC | PRN
  Administered 2016-06-08: 1 via TOPICAL
  Filled 2016-06-08: qty 56

## 2016-06-08 MED ORDER — FENTANYL CITRATE (PF) 100 MCG/2ML IJ SOLN: 100.0000 ug | INTRAMUSCULAR | Status: DC | PRN

## 2016-06-08 MED ORDER — OXYCODONE-ACETAMINOPHEN 5-325 MG PO TABS: 1.0000 | ORAL_TABLET | ORAL | Status: DC | PRN

## 2016-06-08 MED ORDER — FLEET ENEMA 7-19 GM/118ML RE ENEM: 1.0000 | ENEMA | RECTAL | Status: DC | PRN

## 2016-06-08 MED ORDER — OXYTOCIN 40 UNITS IN LACTATED RINGERS INFUSION - SIMPLE MED
2.5000 [IU]/h | INTRAVENOUS | Status: DC
  Filled 2016-06-08: qty 1000

## 2016-06-08 MED ORDER — MEASLES, MUMPS & RUBELLA VAC ~~LOC~~ INJ
0.5000 mL | INJECTION | Freq: Once | SUBCUTANEOUS | Status: DC
  Filled 2016-06-08: qty 0.5

## 2016-06-08 MED ORDER — SIMETHICONE 80 MG PO CHEW: 80.0000 mg | CHEWABLE_TABLET | ORAL | Status: DC | PRN

## 2016-06-08 MED ORDER — PHENYLEPHRINE 40 MCG/ML (10ML) SYRINGE FOR IV PUSH (FOR BLOOD PRESSURE SUPPORT)
80.0000 ug | PREFILLED_SYRINGE | INTRAVENOUS | Status: DC | PRN
  Filled 2016-06-08 (×2): qty 10

## 2016-06-08 MED ORDER — WITCH HAZEL-GLYCERIN EX PADS: 1.0000 "application " | MEDICATED_PAD | CUTANEOUS | Status: DC | PRN

## 2016-06-08 MED ORDER — COCONUT OIL OIL
1.0000 "application " | TOPICAL_OIL | Status: DC | PRN
  Administered 2016-06-09: 1 via TOPICAL
  Filled 2016-06-08: qty 120

## 2016-06-08 MED ORDER — ZOLPIDEM TARTRATE 5 MG PO TABS: 5.0000 mg | ORAL_TABLET | Freq: Every evening | ORAL | Status: DC | PRN

## 2016-06-08 MED ORDER — FERROUS SULFATE 325 (65 FE) MG PO TABS
325.0000 mg | ORAL_TABLET | Freq: Two times a day (BID) | ORAL | Status: DC
  Administered 2016-06-09 – 2016-06-10 (×3): 325 mg via ORAL
  Filled 2016-06-08 (×3): qty 1

## 2016-06-08 MED ORDER — PRENATAL MULTIVITAMIN CH
1.0000 | ORAL_TABLET | Freq: Every day | ORAL | Status: DC
  Administered 2016-06-09 – 2016-06-10 (×2): 1 via ORAL
  Filled 2016-06-08 (×2): qty 1

## 2016-06-08 MED ORDER — LIDOCAINE HCL (PF) 1 % IJ SOLN
30.0000 mL | INTRAMUSCULAR | Status: DC | PRN
  Filled 2016-06-08: qty 30

## 2016-06-08 MED ORDER — LACTATED RINGERS IV SOLN: 500.0000 mL | INTRAVENOUS | Status: DC | PRN

## 2016-06-08 MED ORDER — ONDANSETRON HCL 4 MG/2ML IJ SOLN: 4.0000 mg | INTRAMUSCULAR | Status: DC | PRN

## 2016-06-08 MED ORDER — ONDANSETRON HCL 4 MG/2ML IJ SOLN: 4.0000 mg | Freq: Four times a day (QID) | INTRAMUSCULAR | Status: DC | PRN

## 2016-06-08 MED ORDER — SENNOSIDES-DOCUSATE SODIUM 8.6-50 MG PO TABS
2.0000 | ORAL_TABLET | ORAL | Status: DC
  Administered 2016-06-09 (×2): 2 via ORAL
  Filled 2016-06-08 (×3): qty 2

## 2016-06-08 MED ORDER — OXYTOCIN 40 UNITS IN LACTATED RINGERS INFUSION - SIMPLE MED
1.0000 m[IU]/min | INTRAVENOUS | Status: DC
  Administered 2016-06-08: 2 m[IU]/min via INTRAVENOUS

## 2016-06-08 MED ORDER — LACTATED RINGERS IV SOLN
INTRAVENOUS | Status: DC
  Administered 2016-06-08: 16:00:00 via INTRAVENOUS

## 2016-06-08 MED ORDER — LACTATED RINGERS IV SOLN: 500.0000 mL | Freq: Once | INTRAVENOUS | Status: DC

## 2016-06-08 MED ORDER — OXYTOCIN BOLUS FROM INFUSION
500.0000 mL | Freq: Once | INTRAVENOUS | Status: AC
  Administered 2016-06-08: 500 mL via INTRAVENOUS

## 2016-06-08 NOTE — Progress Notes (Signed)
Subjective:  Comfortable with  Epidural but reports increased rectal pressure Contractions every 2 minutes, lasting 45 seconds on Pitocin 4 mU/min    Objective: BP 109/73   Pulse 84   Temp 98.6 F (37 C) (Oral)   Resp 18   Ht 5\' 2"  (1.575 m)   Wt 188 lb (85.3 kg)   LMP 08/28/2015   BMI 34.39 kg/m  No intake/output data recorded. No intake/output data recorded.  FHT:  Category 1 SVE:   Dilation: Lip/rim Effacement (%): 90 Station: -1, -2 Exam by:: Dr. Cletis Media  Labs: Lab Results  Component Value Date   WBC 11.5 (H) 06/08/2016   HGB 11.9 (L) 06/08/2016   HCT 34.6 (L) 06/08/2016   MCV 71.0 (L) 06/08/2016   PLT 227 06/08/2016    Assessment / Plan: Augmentation of labor, progressing well Fetal Wellbeing: reassuring Anticipated MOD:  NSVD  Christpoher Sievers A 06/08/2016, 5:12 PM

## 2016-06-08 NOTE — Progress Notes (Signed)
  Subjective:  Comfortable with  Epidural Contractions have spaced out significantly    Objective: BP 104/70   Pulse 79   Temp 98.6 F (37 C) (Oral)   Resp 20   Ht 5\' 2"  (1.575 m)   Wt 188 lb (85.3 kg)   LMP 08/28/2015   BMI 34.39 kg/m  No intake/output data recorded. No intake/output data recorded.  FHT: Category 1 SVE:   Dilation: 5 Effacement (%): 90 Station: -1, -2 Exam by:: Dr. Cletis Media     IUPC inserted easily  Labs: Lab Results  Component Value Date   WBC 11.5 (H) 06/08/2016   HGB 11.9 (L) 06/08/2016   HCT 34.6 (L) 06/08/2016   MCV 71.0 (L) 06/08/2016   PLT 227 06/08/2016    Assessment / Plan: Will start Pitocin to obtain adequate MVUs Fetal Wellbeing: reassuring Anticipated MOD:  NSVD  Rhonda Pierce 06/08/2016, 2:08 PM

## 2016-06-08 NOTE — H&P (Signed)
Rhonda Pierce is a 25 y.o. female G2P0 at 40+5 weeks  presenting for onset of labor. She reports strong contractions since 6:00 am without LOF or bleeding. Also reports good fetal activity.  Pregnancy followed at [facility] since 9+3  weeks and remarkable for:  1. Hemoglobin E trait 2. anemia  OB History    Gravida Para Term Preterm AB Living   2 0 0 0 1 0   SAB TAB Ectopic Multiple Live Births     1           Past Medical History:  Diagnosis Date  . Medical history non-contributory    Past Surgical History:  Procedure Laterality Date  . NO PAST SURGERIES    . THERAPEUTIC ABORTION      Family History:   family history includes Cancer in her maternal grandfather and mother. Social History:    reports that she has quit smoking. Her smoking use included Cigarettes. She has never used smokeless tobacco. She reports that she does not drink alcohol or use drugs.   Prenatal labs: ABO, Rh: O/Positive/-- (01/30 0000) Antibody: Negative (01/30 0000) Rubella: immune RPR: Nonreactive (01/30 0000)  HBsAg: Negative (01/30 0000)  HIV: Non-reactive (01/30 0000)  GBS: Negative (08/11 0000)    Prenatal Transfer Tool  Maternal Diabetes: No Genetic Screening: Normal Maternal Ultrasounds/Referrals: Normal Fetal Ultrasounds or other Referrals:  Referred to [facility] for suspicion of ASD which was not confirmed. All anatomy normal  Maternal Substance Abuse:  No Significant Maternal Medications:  None Significant Maternal Lab Results: None   Dilation: 4 Effacement (%): 90 Station: -1 Exam by:: dr Cletis Media Blood pressure 113/84, pulse 85, temperature 98.6 F (37 C), temperature source Oral, resp. rate 18, height 5\' 2"  (1.575 m), weight 188 lb (85.3 kg), last menstrual period 08/28/2015.   AROM with clear fluid  General Appearance: Alert, appropriate appearance for age. No acute distress HEENT Exam: Grossly normal Chest/Respiratory Exam: Normal chest wall and respirations.  Clear to auscultation  Cardiovascular Exam: Regular rate and rhythm. S1, S2, no murmur Gastrointestinal Exam: soft, non-tender, Uterus gravid with size compatible with GA, Vertex presentation by Leopold's maneuvers Psychiatric Exam: Alert and oriented, appropriate affect  ++++++++++++++++++++++++++++++++++++++++++++++++++++++++++++++++  Fetal tracings: Category 1  ++++++++++++++++++++++++++++++++++++++++++++++++++++++++++++++++   Assessment/Plan:  SIUP at 40+5 weeks in early labor SVD expected   [name] MD 06/08/2016, 8:57 AM

## 2016-06-08 NOTE — Anesthesia Preprocedure Evaluation (Signed)

## 2016-06-08 NOTE — MAU Note (Addendum)
Having pains in lower area. Coming every 5 min. Much more intense this time. Membranes swept yesterday, was 3 cm.  Small amt of bleeding since then.

## 2016-06-08 NOTE — Anesthesia Pain Management Evaluation Note (Signed)
  CRNA Pain Management Visit Note  Patient: Rhonda Pierce, 25 y.o., female  "Hello I am a member of the anesthesia team at [facility]. We have an anesthesia team available at all times to provide care throughout the hospital, including epidural management and anesthesia for C-section. I don't know your plan for the delivery whether it a natural birth, water birth, IV sedation, nitrous supplementation, doula or epidural, but we want to meet your pain goals."   1.Was your pain managed to your expectations on prior hospitalizations?   No prior hospitalizations  2.What is your expectation for pain management during this hospitalization?     Epidural  3.How can we help you reach that goal? unsure  Record the patient's initial score and the patient's pain goal.   Pain: 10  Pain Goal: 10 [facility] wants you to be able to say your pain was always managed very well.  Casimer Lanius 06/08/2016

## 2016-06-09 LAB — CBC
HCT: 29.7 % — ABNORMAL LOW (ref 36.0–46.0)
HEMOGLOBIN: 10.2 g/dL — AB (ref 12.0–15.0)
MCH: 24.2 pg — AB (ref 26.0–34.0)
MCHC: 34.3 g/dL (ref 30.0–36.0)
MCV: 70.5 fL — ABNORMAL LOW (ref 78.0–100.0)
Platelets: 188 10*3/uL (ref 150–400)
RBC: 4.21 MIL/uL (ref 3.87–5.11)
RDW: 14.3 % (ref 11.5–15.5)
WBC: 16.5 10*3/uL — ABNORMAL HIGH (ref 4.0–10.5)

## 2016-06-09 NOTE — Lactation Note (Signed)
This note was copied from a baby's chart. Lactation Consultation Note New mom w/semi flat/very short shaft small nipples. Large breast. Hand expression taught w/no colostrum noted. Breast tender. Attempting to latch baby in football position. Baby not opening very wide. Chin tug done. Baby had cried prior to putting on the breast. Baby had been STS in football position approx 5 min. W/o suckling. Noted increased respirations 95 q min. Waited 5 min. 87 q min. Removed from football hold, placed STS on moms chest. Noted moms nipple slanted appearance. Discussed respirations w/mom, doing STS, and not BF if has increased respirations and to call RN if noted. Baby started to have stool before leaving. Reported to RN.  Hand pump given to pre-pump prior to latching to evert nipple. Taught how to clean and use. Shells given to wear in bra to evert nipples. Mom currently doesn't have bra on, doing STS. Will apply later.  Mom encouraged to feed baby 8-12 times/24 hours and with feeding cues. Referred to Baby and Me Book in Breastfeeding section Pg. 22-23 for position options and Proper latch demonstration.educated about newborn behaviorWH/LC brochure given w/resources, support groups and [place] services.  Patient Name: Rhonda Pierce [id number] Date: 06/09/2016 Reason for consult: Initial assessment   Maternal Data Has patient been taught Hand Expression?: Yes  Feeding Length of feed: 0 min  LATCH Score/Interventions Latch: Too sleepy or reluctant, no latch achieved, no sucking elicited. Intervention(s): Skin to skin  Audible Swallowing: None Intervention(s): Skin to skin;Hand expression  Type of Nipple: Everted at rest and after stimulation (semi flat/very short shaft)  Comfort (Breast/Nipple): Soft / non-tender     Hold (Positioning): Full assist, staff holds infant at breast Intervention(s): Breastfeeding basics reviewed;Support Pillows;Position options;Skin to skin  LATCH Score: 4  Lactation Tools  Discussed/Used Tools: Shells;Pump Shell Type: Inverted Breast pump type: Manual WIC Program: Yes Pump Review: Setup, frequency, and cleaning;Milk Storage Initiated by:: Allayne Stack RN IBCLC Date initiated:: 06/09/16   Consult Status Consult Status: Follow-up Date: 06/09/16 Follow-up type: In-patient    Kyomi Hector, Elta Gu[place] 06/09/2016, 1:35 AM

## 2016-06-09 NOTE — Lactation Note (Signed)
This note was copied from a baby's chart. Lactation Consultation Note  Patient Name: Rhonda Pierce [id number] Date: 06/09/2016 Reason for consult: Follow-up assessment Baby at 22 hr of life and mom is reporting bilateral nipple pain. There is a hairline bright red ring around the base of the L nipple shaft. Mom is using coconut oil. Baby does pull in the upper lip when latching but will flange lip easily after 2-3 sucks. Assisted mom with positioning baby in cross cradle. Reviewed nipple care. Parents are aware of lactation services and support group. They will call as needed.    Maternal Data    Feeding Feeding Type: Breast Fed Nipple Type: Slow - flow  LATCH Score/Interventions Latch: Grasps breast easily, tongue down, lips flanged, rhythmical sucking. Intervention(s): Skin to skin Intervention(s): Adjust position;Breast compression  Audible Swallowing: Spontaneous and intermittent Intervention(s): Hand expression  Type of Nipple: Everted at rest and after stimulation Intervention(s): Shells  Comfort (Breast/Nipple): Filling, red/small blisters or bruises, mild/mod discomfort  Problem noted: Mild/Moderate discomfort;Cracked, bleeding, blisters, bruises;Severe discomfort Interventions  (Cracked/bleeding/bruising/blister): Expressed breast milk to nipple Interventions (Mild/moderate discomfort):  (coconut oil )  Hold (Positioning): Assistance needed to correctly position infant at breast and maintain latch. Intervention(s): Support Pillows;Position options  LATCH Score: 8  Lactation Tools Discussed/Used     Consult Status Consult Status: Follow-up Date: 06/10/16 Follow-up type: In-patient    Denzil Hughes 06/09/2016, 4:20 PM

## 2016-06-09 NOTE — Anesthesia Postprocedure Evaluation (Signed)
Anesthesia Post Note  Patient: Rhonda Pierce  Procedure(s) Performed: * No procedures listed *  Patient location during evaluation: Mother Baby Anesthesia Type: Epidural Level of consciousness: awake, awake and alert, oriented and patient cooperative Pain management: pain level controlled Vital Signs Assessment: post-procedure vital signs reviewed and stable Respiratory status: spontaneous breathing, nonlabored ventilation and respiratory function stable Cardiovascular status: stable Postop Assessment: patient able to bend at knees, no headache, no backache and no signs of nausea or vomiting Anesthetic complications: no     Last Vitals:  Vitals:   06/09/16 0300 06/09/16 0610  BP: 102/63 106/66  Pulse: 96 83  Resp: 18 18  Temp: 36.9 C 36.8 C    Last Pain:  Vitals:   06/09/16 0622  TempSrc:   PainSc: 5    Pain Goal:                 Marybeth Dandy L

## 2016-06-09 NOTE — Progress Notes (Signed)
Post Partum Day 1 Subjective: no complaints, up ad lib, voiding, tolerating PO and + flatus.  Pt does not want circumcision and is undecided about BC.  Objective: Blood pressure 106/66, pulse 83, temperature 98.2 F (36.8 C), temperature source Oral, resp. rate 18, height 5\' 2"  (1.575 m), weight 188 lb (85.3 kg), last menstrual period 08/28/2015, unknown if currently breastfeeding.  Physical Exam:  General: alert and no distress Lochia: appropriate Uterine Fundus: firm Incision: n/a DVT Evaluation: No evidence of DVT seen on physical exam.   Recent Labs  06/08/16 0807 06/09/16 0525  HGB 11.9* 10.2*  HCT 34.6* 29.7*    Assessment/Plan: Plan for discharge tomorrow, Breastfeeding and Bottle feeding and Contraception undecided Cont routine PP care   LOS: 1 day   Rhonda Pierce Y 06/09/2016, 11:37 AM

## 2016-06-10 ENCOUNTER — Inpatient Hospital Stay (HOSPITAL_COMMUNITY): Admission: RE | Admit: 2016-06-10 | Payer: Medicaid Other | Source: Ambulatory Visit

## 2016-06-10 LAB — BIRTH TISSUE RECOVERY COLLECTION (PLACENTA DONATION)

## 2016-06-10 MED ORDER — IBUPROFEN 600 MG PO TABS: 600.0000 mg | ORAL_TABLET | Freq: Four times a day (QID) | ORAL | 0 refills | Status: DC

## 2016-06-10 NOTE — Discharge Instructions (Signed)

## 2016-06-10 NOTE — Lactation Note (Signed)
This note was copied from a baby's chart. Lactation Consultation Note  Mother's nipples are tender and she states she has had a difficult time latching. Encouraged mother to apply ebm. Offered assistance w/ latching but mother states she recently gave baby 40 ml of formula. Reviewed volume guidelines with mother.  Reviewed supply and demand and pumping options. Mother undecided as to whether she wants to breastfeed or pump or formula feed. Reviewed engorgement care and monitoring voids/stools. Mom encouraged to feed baby 8-12 times/24 hours and with feeding cues.  Suggest she call if she would like assistance w/ latching.  Patient Name: Rhonda Pierce [id number] Date: 06/10/2016     Maternal Data    Feeding Feeding Type: Bottle Fed - Formula Nipple Type: Slow - flow  LATCH Score/Interventions                      Lactation Tools Discussed/Used     Consult Status      Rhonda Pierce 06/10/2016, 12:41 PM

## 2016-06-10 NOTE — Discharge Summary (Signed)
  Obstetric Discharge Summary  Reason for Admission: onset of labor on 06/08/16 Prenatal Procedures: none Intrapartum Procedures: spontaneous vaginal delivery by Dr Cletis Media on 06/08/16 Postpartum Procedures: none Complications-Operative and Postpartum: peri-clitoridal, left vulvar lacerations and 1st degree  Hemoglobin  Date Value Ref Range Status  06/09/2016 10.2 (L) 12.0 - 15.0 g/dL Final   HCT  Date Value Ref Range Status  06/09/2016 29.7 (L) 36.0 - 46.0 % Final    Discharge Diagnoses: Term Pregnancy-delivered  Physical exam:   General: normal Lochia: appropriate Uterine Fundus: 0/2 firm non-tender  Extremities: No evidence of DVT seen on physical exam. Edema 1+   Hospital course: uncomplicated  Date: [id number] Activity: unrestricted Diet: routine Medications: Ibuprofen Condition: stable  Breastfeeding:   Yes.   Contraception:  condoms  Instructions: refer to practice specific booklet Discharge to: home   Newborn Data:   Baby female Name: Rhonda Pierce A MD 06/10/2016, 7:42 AM

## 2016-07-01 MED ORDER — FENTANYL 2.5 MCG/ML BUPIVACAINE 1/10 % EPIDURAL INFUSION (WH - ANES)
INTRAMUSCULAR | Status: DC | PRN
  Administered 2016-06-08: 14 mL/h via EPIDURAL

## 2016-07-01 MED ORDER — LIDOCAINE HCL (PF) 1 % IJ SOLN
INTRAMUSCULAR | Status: DC | PRN
  Administered 2016-06-08: 4 mL
  Administered 2016-06-08: 6 mL via EPIDURAL

## 2016-07-01 NOTE — Anesthesia Procedure Notes (Signed)
Epidural Patient location during procedure: OB Start time: 06/08/2016 12:38 PM  Preanesthetic Checklist Completed: patient identified, site marked, surgical consent, pre-op evaluation, timeout performed, IV checked, risks and benefits discussed and monitors and equipment checked  Epidural Patient position: sitting Prep: site prepped and draped and DuraPrep Patient monitoring: continuous pulse ox and blood pressure Approach: midline Location: L3-L4 Injection technique: LOR air  Needle:  Needle type: Tuohy  Needle gauge: 17 G Needle length: 9 cm and 9 Needle insertion depth: 5 cm cm Catheter type: closed end flexible Catheter size: 19 Gauge Catheter at skin depth: 10 cm Test dose: negative  Assessment Events: blood not aspirated, injection not painful, no injection resistance, negative IV test and no paresthesia  Additional Notes Dosing of Epidural:  1st dose, through catheter ............................................Marland Kitchen  Xylocaine 40 mg  2nd dose, through catheter, after waiting 3 minutes........Marland KitchenXylocaine 60 mg    As each dose occurred, patient was free of IV sx; and patient exhibited no evidence of SA injection.  Patient is more comfortable after epidural dosed. Please see RN's note for documentation of vital signs,and FHR which are stable.  Patient reminded not to try to ambulate with numb legs, and that an RN must be present when she attempts to get up.

## 2017-09-12 IMAGING — US US OB COMP LESS 14 WK
1 series · 15 of 28 positions shown · non-contrast
Comparison: None.

CLINICAL DATA: Acute onset of generalized abdominal pain. Initial
encounter.

EXAM:
OBSTETRIC <14 WK US AND TRANSVAGINAL OB US
TECHNIQUE: Both transabdominal and transvaginal ultrasound examinations were
performed for complete evaluation of the gestation as well as the
maternal uterus, adnexal regions, and pelvic cul-de-sac.
Transvaginal technique was performed to assess early pregnancy.

[Series 1: us ob comp less 14 wk · 54 acquisitions, 15 frames shown]
[im 1/54]
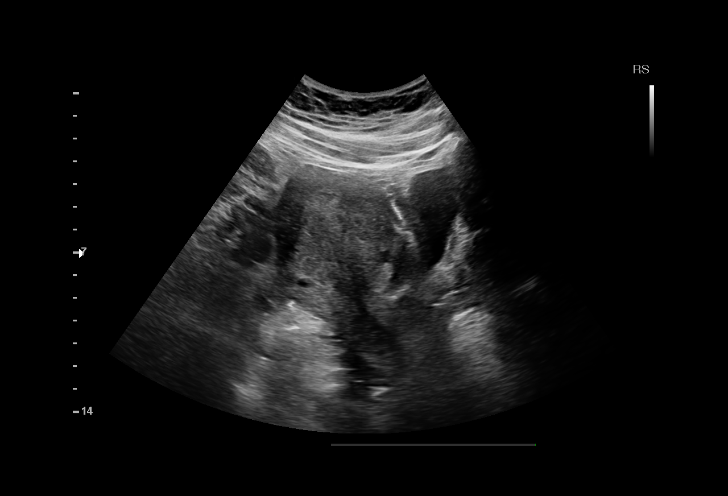
[im 4/54]
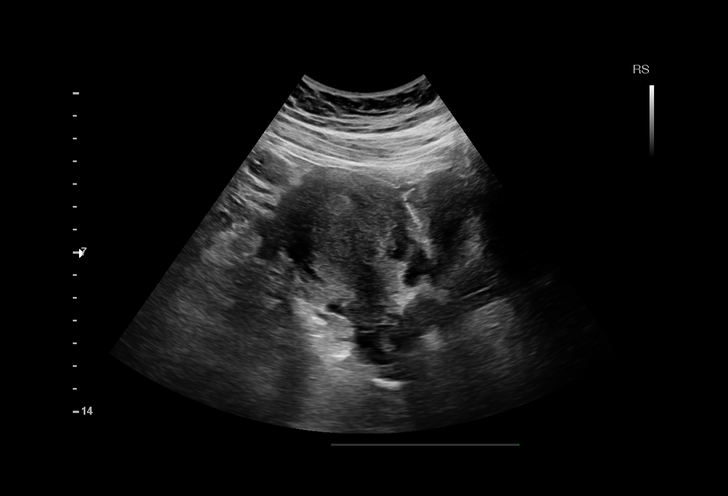
[im 8/54]
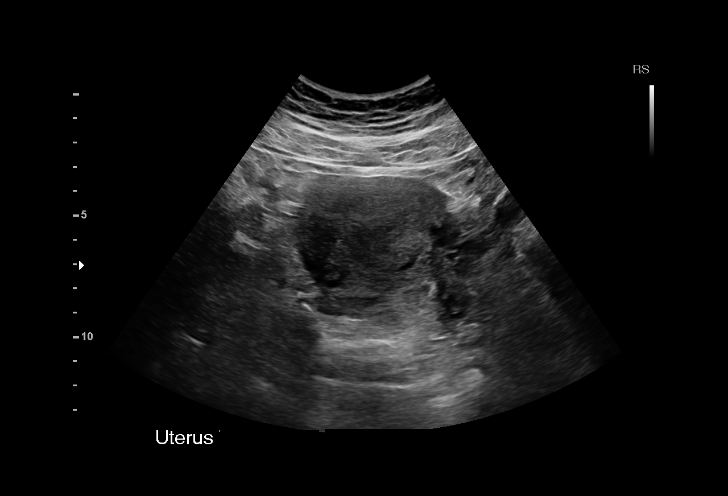
[im 12/54]
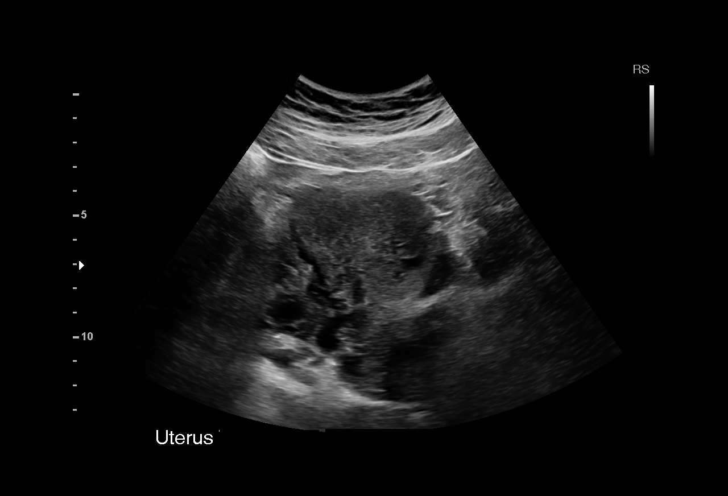
[im 16/54]
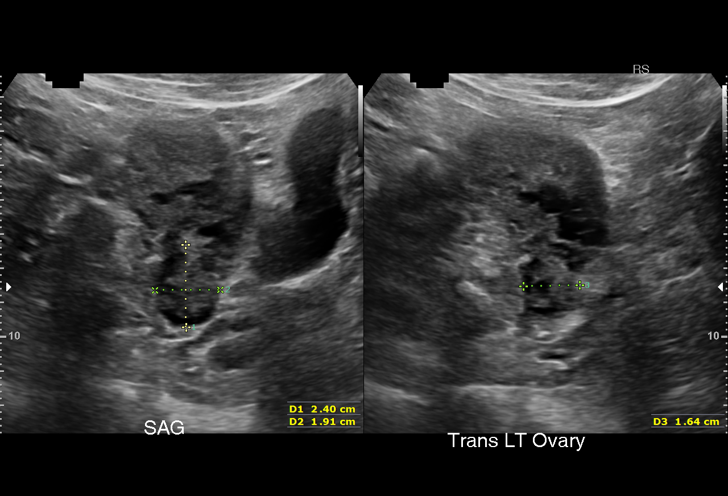
[im 20/54]
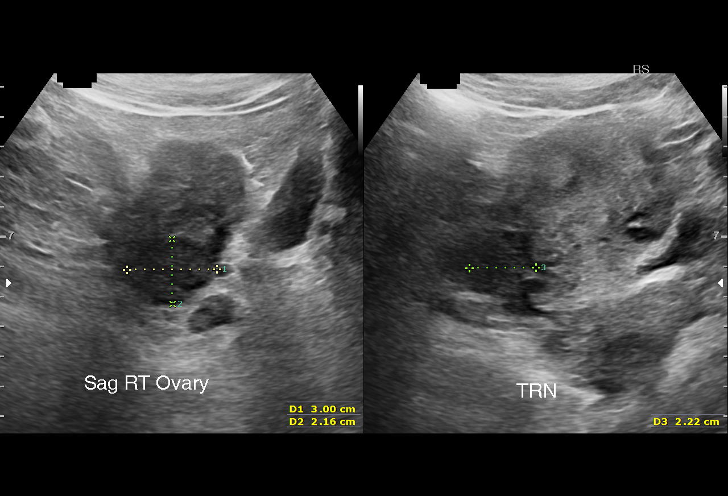
[im 24/54]
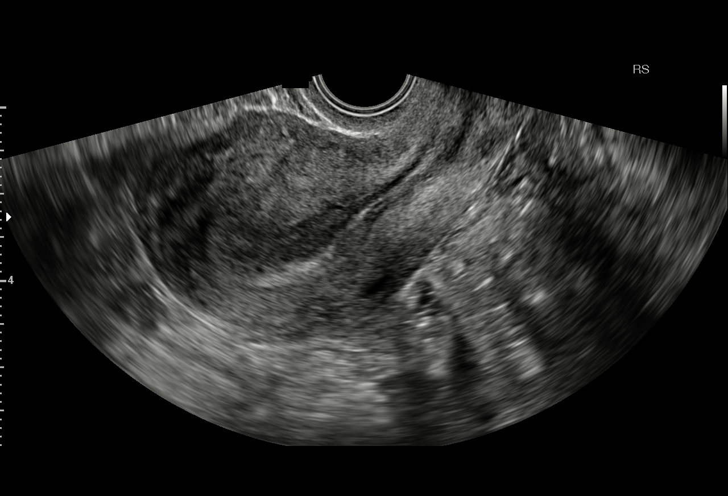
[im 28/54]
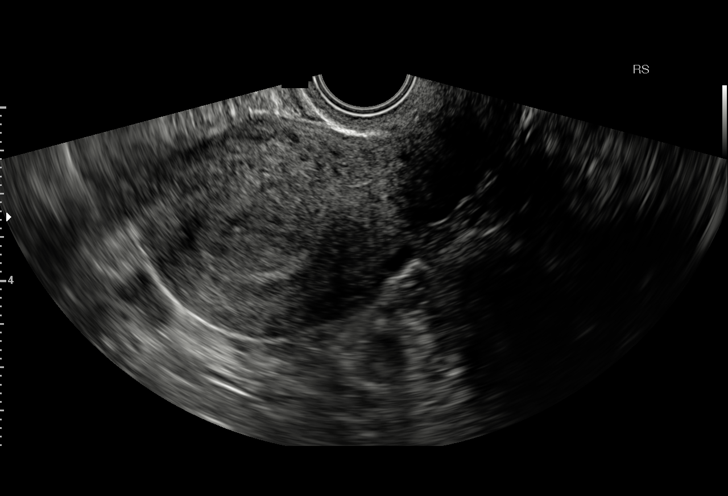
[im 30/54]
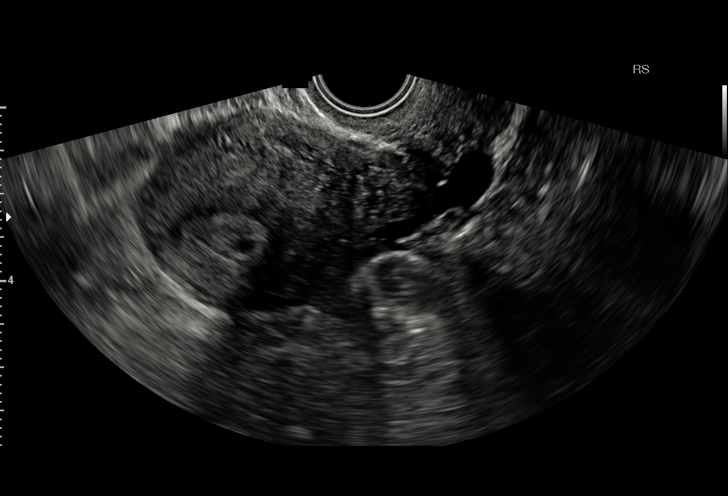
[im 34/54]
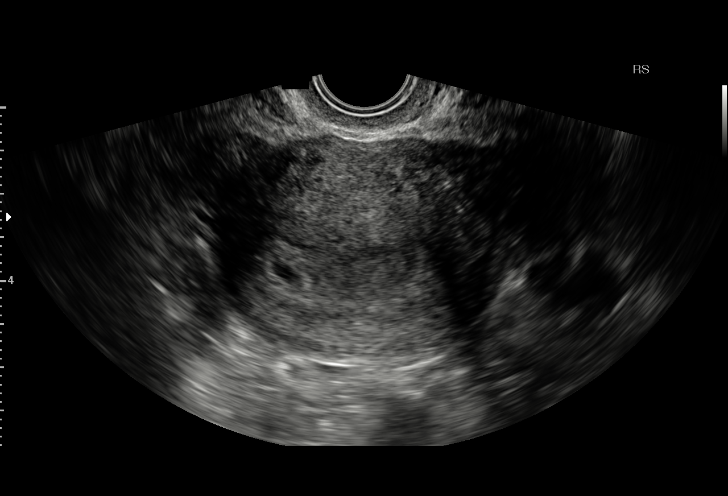
[im 38/54]
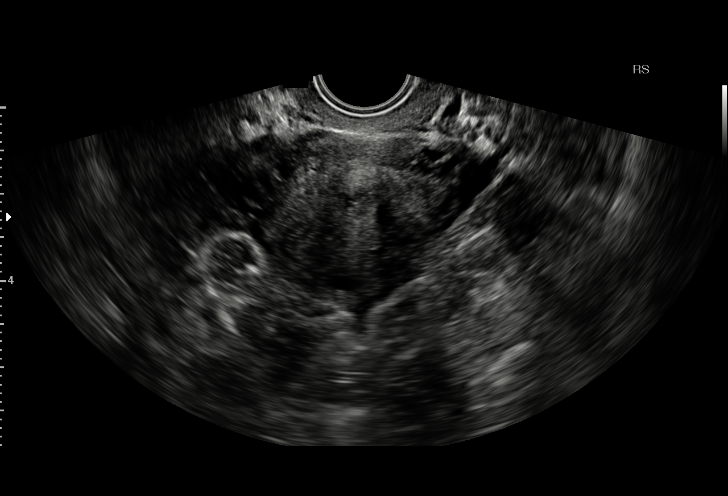
[im 42/54]
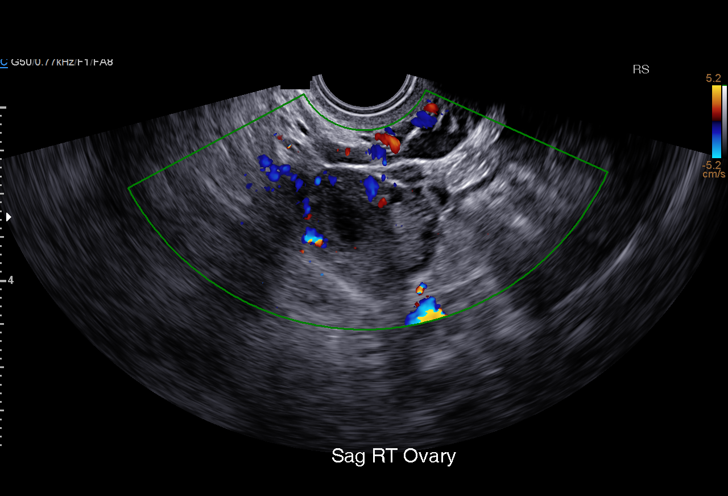
[im 46/54]
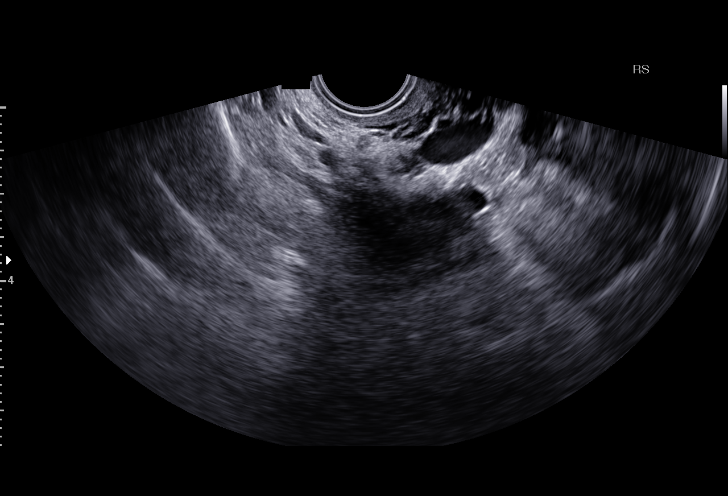
[im 50/54]
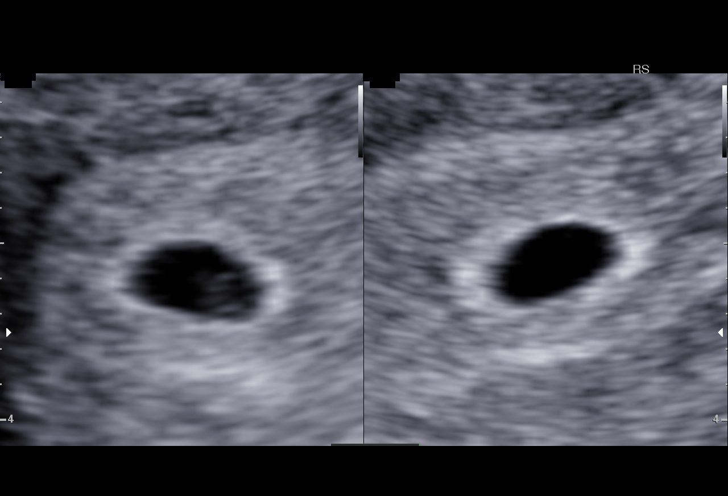
[im 54/54]
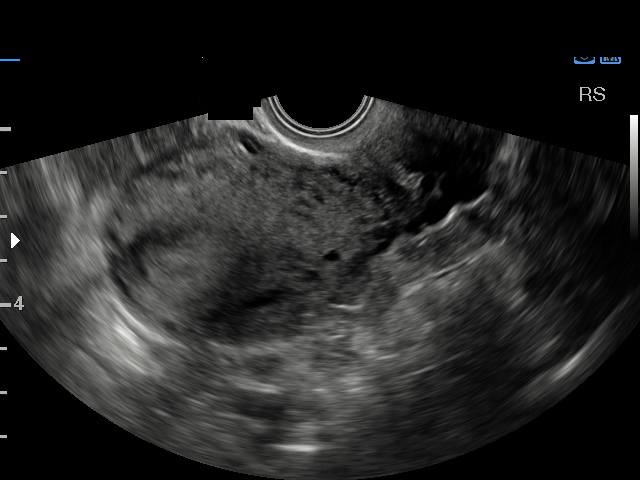

[15 of 28 positions shown; findings below may reference images not displayed]

FINDINGS: Intrauterine gestational sac: Visualized/normal in shape.

Yolk sac:  Yes, though not well characterized.

Embryo:  No

Cardiac Activity: N/A

MSD: 6.0  mm   5 w   2  d

Subchorionic hemorrhage:  No subchorionic hemorrhage is noted.

Maternal uterus/adnexae: The uterus is unremarkable in appearance.

The ovaries are unremarkable. The right ovary measures 3.8 x 3.0 x
2.8 cm, while the left ovary measures 2.5 x 2.2 x 1.9 cm. No
suspicious adnexal masses are seen; there is no evidence for ovarian
torsion.

No free fluid is seen within the pelvic cul-de-sac.
IMPRESSION: Single intrauterine gestational sac noted, with a mean sac diameter
of 6 mm, corresponding to a gestational age of 5 weeks 2 days. This
matches the gestational age of 6 weeks 1 day by LMP, reflecting an
estimated date of delivery June 01, 2016. A yolk sac is
visualized. No embryo is yet seen.

## 2017-11-28 DIAGNOSIS — Z3200 Encounter for pregnancy test, result unknown: Secondary | ICD-10-CM | POA: Diagnosis not present

## 2017-11-28 DIAGNOSIS — R1084 Generalized abdominal pain: Secondary | ICD-10-CM | POA: Diagnosis not present

## 2017-11-28 DIAGNOSIS — R112 Nausea with vomiting, unspecified: Secondary | ICD-10-CM | POA: Diagnosis not present

## 2017-11-28 DIAGNOSIS — A084 Viral intestinal infection, unspecified: Secondary | ICD-10-CM | POA: Diagnosis not present

## 2018-01-13 DIAGNOSIS — Z79899 Other long term (current) drug therapy: Secondary | ICD-10-CM | POA: Diagnosis not present

## 2018-01-13 DIAGNOSIS — E559 Vitamin D deficiency, unspecified: Secondary | ICD-10-CM | POA: Diagnosis not present

## 2018-01-13 DIAGNOSIS — E663 Overweight: Secondary | ICD-10-CM | POA: Diagnosis not present

## 2018-01-13 DIAGNOSIS — J302 Other seasonal allergic rhinitis: Secondary | ICD-10-CM | POA: Diagnosis not present

## 2018-01-13 DIAGNOSIS — E7800 Pure hypercholesterolemia, unspecified: Secondary | ICD-10-CM | POA: Diagnosis not present

## 2018-01-16 DIAGNOSIS — J302 Other seasonal allergic rhinitis: Secondary | ICD-10-CM | POA: Diagnosis not present

## 2018-01-16 DIAGNOSIS — E7800 Pure hypercholesterolemia, unspecified: Secondary | ICD-10-CM | POA: Diagnosis not present

## 2018-01-16 DIAGNOSIS — H6692 Otitis media, unspecified, left ear: Secondary | ICD-10-CM | POA: Diagnosis not present

## 2018-01-16 DIAGNOSIS — J018 Other acute sinusitis: Secondary | ICD-10-CM | POA: Diagnosis not present

## 2018-02-27 ENCOUNTER — Encounter (HOSPITAL_COMMUNITY): Payer: Self-pay | Admitting: *Deleted

## 2018-02-27 ENCOUNTER — Other Ambulatory Visit: Payer: Self-pay

## 2018-02-27 ENCOUNTER — Emergency Department (HOSPITAL_COMMUNITY)
Admission: EM | Admit: 2018-02-27 | Discharge: 2018-02-27 | Disposition: A | Discharge status: Home / Self Care | Payer: 59 | Attending: Emergency Medicine | Admitting: Emergency Medicine

## 2018-02-27 ENCOUNTER — Ambulatory Visit (HOSPITAL_COMMUNITY): Admission: EM | Admit: 2018-02-27 | Discharge: 2018-02-27 | Payer: 59 | Source: Home / Self Care

## 2018-02-27 DIAGNOSIS — Z79899 Other long term (current) drug therapy: Secondary | ICD-10-CM | POA: Insufficient documentation

## 2018-02-27 DIAGNOSIS — Y999 Unspecified external cause status: Secondary | ICD-10-CM | POA: Diagnosis not present

## 2018-02-27 DIAGNOSIS — S61012A Laceration without foreign body of left thumb without damage to nail, initial encounter: Secondary | ICD-10-CM | POA: Diagnosis not present

## 2018-02-27 DIAGNOSIS — Z87891 Personal history of nicotine dependence: Secondary | ICD-10-CM | POA: Diagnosis not present

## 2018-02-27 DIAGNOSIS — Y929 Unspecified place or not applicable: Secondary | ICD-10-CM | POA: Insufficient documentation

## 2018-02-27 DIAGNOSIS — W260XXA Contact with knife, initial encounter: Secondary | ICD-10-CM | POA: Diagnosis not present

## 2018-02-27 DIAGNOSIS — Y9389 Activity, other specified: Secondary | ICD-10-CM | POA: Insufficient documentation

## 2018-02-27 MED ORDER — BACITRACIN ZINC 500 UNIT/GM EX OINT
TOPICAL_OINTMENT | Freq: Two times a day (BID) | CUTANEOUS | Status: DC
  Administered 2018-02-27: 1 via TOPICAL
  Filled 2018-02-27: qty 0.9

## 2018-02-27 MED ORDER — LIDOCAINE HCL (PF) 1 % IJ SOLN
5.0000 mL | Freq: Once | INTRAMUSCULAR | Status: AC
  Administered 2018-02-27: 5 mL
  Filled 2018-02-27: qty 30

## 2018-02-27 NOTE — ED Notes (Signed)
Bed: WA08 Expected date:  Expected time:  Means of arrival:  Comments: 

## 2018-02-27 NOTE — Discharge Instructions (Addendum)
Return in 10-12 days for suture removal.  You can return to this ED, any ED, any urgent care or your primary care doctor's office for removal. Please have this paperwork with you if you go elsewhere. Return sooner for any signs of infection including worsening pain, swelling, expanding erythema especially if it streaks away from the affected area, fever.

## 2018-02-27 NOTE — ED Provider Notes (Signed)
[facility] DEPT Provider Note   CSN: [id number] Arrival date & time: 02/27/18  2010     History   Chief Complaint Chief Complaint  Patient presents with  . Laceration    left thumb    HPI Rhonda Pierce is a 27 y.o. female who presents to ED for evaluation of nondominant left first digit laceration.  She was cleaning and trying to open something with a knife when she excellently cut her finger.  She ran the area under water.  She denies any blood thinner use.  She has not taking any medications to help with pain.  Patient is unsure of last tetanus.    HPI  Past Medical History:  Diagnosis Date  . Medical history non-contributory     Patient Active Problem List   Diagnosis Date Noted  . Vaginal delivery 06/08/2016  . Fall 04/08/2016  . BV (bacterial vaginosis) 04/08/2016  . 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 Anemia affecting pregnancy 03/10/2016    Past Surgical History:  Procedure Laterality Date  . NO PAST SURGERIES    . THERAPEUTIC ABORTION       OB History    Gravida  2   Para  1   Term  1   Preterm  0   AB  1   Living  1     SAB      TAB  1   Ectopic      Multiple  0   Live Births  1            Home Medications    Prior to Admission medications   Medication Sig Start Date End Date Taking? Authorizing Provider  ibuprofen (ADVIL,MOTRIN) 200 MG tablet Take 200 mg by mouth every 6 (six) hours as needed for moderate pain.   Yes [provider]  phentermine (ADIPEX-P) 37.5 MG tablet phentermine 37.5 mg tablet  TAKE 1 TABLET BY MOUTH EVERY MORNING   Yes [provider]  [place] 28 0.25-35 MG-MCG tablet Take 1 tablet by mouth daily. 02/04/18  Yes [provider]  ibuprofen (ADVIL,MOTRIN) 600 MG tablet Take 1 tablet (600 mg total) by mouth every 6 (six) hours. Patient not taking: Reported on 02/27/2018 06/10/16   [name], MD    Family History Family  History  Problem Relation Age of Onset  . Cancer Mother        lung  . Cancer Maternal Grandfather        lung    Social History Social History   Tobacco Use  . Smoking status: Former Smoker    Types: Cigarettes  . Smokeless tobacco: Never Used  . Tobacco comment: occ smoker; quit with preg  Substance Use Topics  . Alcohol use: No    Alcohol/week: 0.0 oz  . Drug use: No     Allergies   Patient has no known allergies.   Review of Systems Review of Systems  Constitutional: Negative for chills and fever.  Musculoskeletal: Negative for arthralgias.  Skin: Positive for wound.     Physical Exam Updated Vital Signs BP (!) 125/94 (BP Location: Right Arm)   Pulse 84   Temp 98.9 F (37.2 C) (Oral)   Resp 14   Ht 5\' 3"  (1.6 m)   Wt 70.3 kg (154 lb 14.4 oz)   LMP 02/03/2018 (Exact Date)   SpO2 100%   BMI 27.44 kg/m   Physical Exam  Constitutional: She appears well-developed and  well-nourished. No distress.  HENT:  Head: Normocephalic and atraumatic.  Eyes: Conjunctivae and EOM are normal. No scleral icterus.  Neck: Normal range of motion.  Pulmonary/Chest: Effort normal. No respiratory distress.  Neurological: She is alert.  Skin: Laceration noted. No rash noted. She is not diaphoretic.  Approximate 1 cm linear laceration across the left thumb IP joint.  Bleeding is controlled.  Normal sensation of finger.  Psychiatric: She has a normal mood and affect.  Nursing note and vitals reviewed.    ED Treatments / Results  Labs (all labs ordered are listed, but only abnormal results are displayed) Labs Reviewed - No data to display  EKG None  Radiology No results found.  Procedures .Marland KitchenLaceration Repair Date/Time: 02/27/2018 10:34 PM Performed by: Delia Heady, PA-C Authorized by: Delia Heady, PA-C   Consent:    Consent obtained:  Verbal   Consent given by:  Patient   Risks discussed:  Infection, need for additional repair, pain, poor cosmetic result,  poor wound healing, retained foreign body, tendon damage, vascular damage and nerve damage Anesthesia (see MAR for exact dosages):    Anesthesia method:  Local infiltration   Local anesthetic:  Lidocaine 1% w/o epi Laceration details:    Location:  Finger   Finger location:  L thumb   Length (cm):  1 Repair type:    Repair type:  Simple Exploration:    Hemostasis achieved with:  Direct pressure Treatment:    Area cleansed with:  Saline and Betadine   Amount of cleaning:  Standard   Irrigation solution:  Sterile saline   Irrigation method:  Syringe Skin repair:    Repair method:  Sutures   Suture size:  4-0   Wound skin closure material used: Ethilon.   Suture technique:  Simple interrupted   Number of sutures:  4 Approximation:    Approximation:  Close Post-procedure details:    Dressing:  Antibiotic ointment   Patient tolerance of procedure:  Tolerated well, no immediate complications   (including critical care time)  Medications Ordered in ED Medications  lidocaine (PF) (XYLOCAINE) 1 % injection 5 mL (has no administration in time range)  bacitracin ointment (has no administration in time range)     Initial Impression / Assessment and Plan / ED Course  I have reviewed the triage vital signs and the nursing notes.  Pertinent labs & imaging results that were available during my care of the patient were reviewed by me and considered in my medical decision making (see chart for details).     Patient presents to ED for left thumb laceration that occurred prior to arrival.  Patient unsure of last tetanus but chart review reports tetanus updated in 2017.  Area was cleaned and repaired with sutures. Patient counseled on wound care. Patient counseled on need to return or see PCP/urgent care for suture removal in 10-12 days. Patient was urged to return to the Emergency Department urgently with worsening pain, swelling, expanding erythema especially if it streaks away from the  affected area, fever, or if they have any other concerns. Patient verbalized understanding.   Portions of this note were generated with [employer]. Dictation errors may occur despite best attempts at proofreading.   Final Clinical Impressions(s) / ED Diagnoses   Final diagnoses:  Laceration of left thumb without foreign body without damage to nail, initial encounter    ED Discharge Orders    None       Delia Heady, PA-C 02/27/18 2236  Sherwood Gambler, MD 02/27/18 [phone]

## 2018-02-27 NOTE — ED Notes (Signed)
Bed: WA07 Expected date:  Expected time:  Means of arrival:  Comments: 

## 2018-02-27 NOTE — ED Triage Notes (Signed)
Pt presents with laceration to left thumb on knuckle.  Bleeding controlled @ present.

## 2018-03-12 DIAGNOSIS — S61019S Laceration without foreign body of unspecified thumb without damage to nail, sequela: Secondary | ICD-10-CM | POA: Diagnosis not present

## 2018-03-12 DIAGNOSIS — R21 Rash and other nonspecific skin eruption: Secondary | ICD-10-CM | POA: Diagnosis not present

## 2018-03-23 DIAGNOSIS — Z0000 Encounter for general adult medical examination without abnormal findings: Secondary | ICD-10-CM | POA: Diagnosis not present

## 2018-03-23 DIAGNOSIS — E559 Vitamin D deficiency, unspecified: Secondary | ICD-10-CM | POA: Diagnosis not present

## 2018-03-23 DIAGNOSIS — R5383 Other fatigue: Secondary | ICD-10-CM | POA: Diagnosis not present

## 2018-03-23 DIAGNOSIS — Z1339 Encounter for screening examination for other mental health and behavioral disorders: Secondary | ICD-10-CM | POA: Diagnosis not present

## 2018-03-23 DIAGNOSIS — E7800 Pure hypercholesterolemia, unspecified: Secondary | ICD-10-CM | POA: Diagnosis not present

## 2018-03-23 DIAGNOSIS — Z79899 Other long term (current) drug therapy: Secondary | ICD-10-CM | POA: Diagnosis not present

## 2018-03-23 DIAGNOSIS — R0602 Shortness of breath: Secondary | ICD-10-CM | POA: Diagnosis not present

## 2018-03-23 DIAGNOSIS — Z114 Encounter for screening for human immunodeficiency virus [HIV]: Secondary | ICD-10-CM | POA: Diagnosis not present

## 2018-03-23 DIAGNOSIS — Z1331 Encounter for screening for depression: Secondary | ICD-10-CM | POA: Diagnosis not present

## 2018-03-23 DIAGNOSIS — R7303 Prediabetes: Secondary | ICD-10-CM | POA: Diagnosis not present

## 2018-04-02 ENCOUNTER — Ambulatory Visit (INDEPENDENT_AMBULATORY_CARE_PROVIDER_SITE_OTHER): Payer: Self-pay | Admitting: Family Medicine

## 2018-04-02 ENCOUNTER — Encounter: Payer: Self-pay | Admitting: Family Medicine

## 2018-04-02 VITALS — BP 110/80 | HR 75 | Temp 98.8°F | Wt 158.6 lb

## 2018-04-02 DIAGNOSIS — L0100 Impetigo, unspecified: Secondary | ICD-10-CM

## 2018-04-02 MED ORDER — DOXYCYCLINE HYCLATE 100 MG PO TABS: 100.0000 mg | ORAL_TABLET | Freq: Two times a day (BID) | ORAL | 0 refills | Status: DC

## 2018-04-02 MED ORDER — MUPIROCIN 2 % EX OINT
TOPICAL_OINTMENT | CUTANEOUS | 0 refills | Status: DC
Start: 2018-04-02 — End: 2018-06-16

## 2018-04-02 NOTE — Progress Notes (Signed)
Rhonda Pierce is a 27 y.o. female who presents today with concerns of need for evaluation of her left thumb that was cut, repaired with stitches which have been removed but pain and weeping of area continues and is causing patient to worry.  Review of Systems  Constitutional: Negative for chills, fever and malaise/fatigue.  HENT: Negative for congestion, ear discharge, ear pain, sinus pain and sore throat.   Eyes: Negative.   Respiratory: Negative for cough, sputum production and shortness of breath.   Cardiovascular: Negative.  Negative for chest pain.  Gastrointestinal: Negative for abdominal pain, diarrhea, nausea and vomiting.  Genitourinary: Negative for dysuria, frequency, hematuria and urgency.  Musculoskeletal: Negative for myalgias.  Skin: Negative.        Left thumb pain  Neurological: Negative for headaches.  Endo/Heme/Allergies: Negative.   Psychiatric/Behavioral: Negative.     O: Vitals:   04/02/18 1623  BP: 110/80  Pulse: 75  Temp: 98.8 F (37.1 C)  SpO2: 99%     Physical Exam  Constitutional: She is oriented to person, place, and time. Vital signs are normal. She appears well-developed and well-nourished. She is active.  Non-toxic appearance. She does not have a sickly appearance.  HENT:  Head: Normocephalic.  Right Ear: Hearing, tympanic membrane, external ear and ear canal normal.  Left Ear: Hearing, tympanic membrane, external ear and ear canal normal.  Nose: Nose normal.  Mouth/Throat: Uvula is midline and oropharynx is clear and moist.  Neck: Normal range of motion. Neck supple.  Cardiovascular: Normal rate, regular rhythm, normal heart sounds and normal pulses.  Pulmonary/Chest: Effort normal and breath sounds normal.  Abdominal: Soft. Bowel sounds are normal.  Musculoskeletal: Normal range of motion.  Lymphadenopathy:       Head (right side): No submental and no submandibular adenopathy present.       Head (left side): No submental and no submandibular  adenopathy present.    She has no cervical adenopathy.  Neurological: She is alert and oriented to person, place, and time.  Skin: Skin is warm. There is erythema.     1 cm x 1 cm area that is crusted and weeping clear with honey colored area- mild edema and tenderness to touch. Full ROM on exam- appearance consistent with impetigo  Psychiatric: She has a normal mood and affect.  Vitals reviewed.    A: 1. Impetigo      P: Exam findings, diagnosis etiology and medication use and indications reviewed with patient. Follow- Up and discharge instructions provided. No emergent/urgent issues found on exam.  Patient verbalized understanding of information provided and agrees with plan of care (POC), all questions answered.  1. Impetigo - mupirocin ointment (BACTROBAN) 2 %; Apply to affected area twice daily x 7 days - doxycycline (VIBRA-TABS) 100 MG tablet; Take 1 tablet (100 mg total) by mouth 2 (two) times daily.

## 2018-04-02 NOTE — Patient Instructions (Signed)
Impetigo, Adult Impetigo is an infection of the skin. It commonly occurs in young children, but it can also occur in adults. The infection causes itchy blisters and sores that produce brownish-yellow fluid. As the fluid dries, it forms a thick, honey-colored crust. These skin changes usually occur on the face but can also affect other areas of the body. Impetigo usually goes away in 7-10 days with treatment. What are the causes? Impetigo is caused by two types of bacteria. It may be caused by staphylococci or streptococci bacteria. These bacteria cause impetigo when they get under the surface of the skin. This often happens after some damage to the skin, such as damage from:  Cuts, scrapes, or scratches.  Insect bites, especially when you scratch the area of a bite.  Chickenpox or other illnesses that cause open skin sores.  Nail biting or chewing.  Impetigo is contagious and can spread easily from one person to another. This may occur through close skin contact or by sharing towels, clothing, or other items with a person who has the infection. What increases the risk? Some things that can increase the risk of getting this infection include:  Playing sports that include skin-to-skin contact with others.  Having a skin condition with open sores.  Having many skin cuts or scrapes.  Living in an area that has high humidity levels.  Having poor hygiene.  Having high levels of staphylococci in your nose.  What are the signs or symptoms? Impetigo usually starts out as small blisters, often on the face. The blisters then break open and turn into tiny sores (lesions) with a yellow crust. In some cases, the blisters cause itching or burning. With scratching, irritation, or lack of treatment, these small lesions may get larger. Scratching can also cause impetigo to spread to other parts of the body. The bacteria can get under the fingernails and spread when you touch another area of your  skin. Other possible symptoms include:  Larger blisters.  Pus.  Swollen lymph glands.  How is this diagnosed? This condition is usually diagnosed during a physical exam. A skin sample or sample of fluid from a blister may be taken for lab tests that involve growing bacteria (culture test). This can help confirm the diagnosis or help determine the best treatment. How is this treated? Mild impetigo can be treated with prescription antibiotic cream. Oral antibiotic medicine may be used in more severe cases. Medicines for itching may also be used. Follow these instructions at home:  Take medicines only as directed by your health care provider.  To help prevent impetigo from spreading to other body areas: ? Keep your fingernails short and clean. ? Do not scratch the blisters or sores. ? Cover infected areas, if necessary, to keep from scratching.  Gently wash the infected areas with antibiotic soap and water.  Soak crusted areas in warm, soapy water using antibiotic soap. ? Gently rub the areas to remove crusts. Do not scrub.  Wash your hands often to avoid spreading this infection.  Stay home until you have used an antibiotic cream for 48 hours (2 days) or an oral antibiotic medicine for 24 hours (1 day). You should only return to work and activities with other people if your skin shows significant improvement. How is this prevented? To keep the infection from spreading:  Stay home until you have used an antibiotic cream for 48 hours or an oral antibiotic for 24 hours.  Wash your hands often.  Do not engage in   skin-to-skin contact with other people while you have still have blisters.  Do not share towels, washcloths, or bedding with others while you have the infection.  Contact a health care provider if:  You develop more blisters or sores despite treatment.  Other family members get sores.  Your skin sores are not improving after 48 hours of treatment.  You have a  fever. Get help right away if:  You see spreading redness or swelling of the skin around your sores.  You see red streaks coming from your sores.  You develop a sore throat. This information is not intended to replace advice given to you by your health care provider. Make sure you discuss any questions you have with your health care provider. Document Released: 10/10/2014 Document Revised: 02/25/2016 Document Reviewed: 09/02/2014 [employer] Interactive Patient Education  2017 [employer]  

## 2018-04-18 ENCOUNTER — Ambulatory Visit (INDEPENDENT_AMBULATORY_CARE_PROVIDER_SITE_OTHER): Payer: Self-pay | Admitting: Nurse Practitioner

## 2018-04-18 VITALS — BP 100/65 | HR 80 | Temp 98.2°F | Resp 16 | Wt 156.8 lb

## 2018-04-18 DIAGNOSIS — K219 Gastro-esophageal reflux disease without esophagitis: Secondary | ICD-10-CM

## 2018-04-18 MED ORDER — RANITIDINE HCL 150 MG PO TABS: 150.0000 mg | ORAL_TABLET | Freq: Two times a day (BID) | ORAL | 1 refills | Status: DC

## 2018-04-18 NOTE — Patient Instructions (Signed)
Gastroesophageal Reflux Disease, Adult May continue to use TUMS as needed. Take Ranitidine twice daily- in the morning and at bedtime.  Normally, food travels down the esophagus and stays in the stomach to be digested. However, when a person has gastroesophageal reflux disease (GERD), food and stomach acid move back up into the esophagus. When this happens, the esophagus becomes sore and inflamed. Over time, GERD can create small holes (ulcers) in the lining of the esophagus. What are the causes? This condition is caused by a problem with the muscle between the esophagus and the stomach (lower esophageal sphincter, or LES). Normally, the LES muscle closes after food passes through the esophagus to the stomach. When the LES is weakened or abnormal, it does not close properly, and that allows food and stomach acid to go back up into the esophagus. The LES can be weakened by certain dietary substances, medicines, and medical conditions, including:  Tobacco use.  Pregnancy.  Having a hiatal hernia.  Heavy alcohol use.  Certain foods and beverages, such as coffee, chocolate, onions, and peppermint.  What increases the risk? This condition is more likely to develop in:  People who have an increased body weight.  People who have connective tissue disorders.  People who use NSAID medicines.  What are the signs or symptoms? Symptoms of this condition include:  Heartburn.  Difficult or painful swallowing.  The feeling of having a lump in the throat.  Abitter taste in the mouth.  Bad breath.  Having a large amount of saliva.  Having an upset or bloated stomach.  Belching.  Chest pain.  Shortness of breath or wheezing.  Ongoing (chronic) cough or a night-time cough.  Wearing away of tooth enamel.  Weight loss.  Different conditions can cause chest pain. Make sure to see your health care provider if you experience chest pain. How is this diagnosed? Your health care  provider will take a medical history and perform a physical exam. To determine if you have mild or severe GERD, your health care provider may also monitor how you respond to treatment. You may also have other tests, including:  An endoscopy toexamine your stomach and esophagus with a small camera.  A test thatmeasures the acidity level in your esophagus.  A test thatmeasures how much pressure is on your esophagus.  A barium swallow or modified barium swallow to show the shape, size, and functioning of your esophagus.  How is this treated? The goal of treatment is to help relieve your symptoms and to prevent complications. Treatment for this condition may vary depending on how severe your symptoms are. Your health care provider may recommend:  Changes to your diet.  Medicine.  Surgery.  Follow these instructions at home: Diet  Follow a diet as recommended by your health care provider. This may involve avoiding foods and drinks such as: ? Coffee and tea (with or without caffeine). ? Drinks that containalcohol. ? Energy drinks and sports drinks. ? Carbonated drinks or sodas. ? Chocolate and cocoa. ? Peppermint and mint flavorings. ? Garlic and onions. ? Horseradish. ? Spicy and acidic foods, including peppers, chili powder, curry powder, vinegar, hot sauces, and barbecue sauce. ? Citrus fruit juices and citrus fruits, such as oranges, lemons, and limes. ? Tomato-based foods, such as red sauce, chili, salsa, and pizza with red sauce. ? Fried and fatty foods, such as donuts, french fries, potato chips, and high-fat dressings. ? High-fat meats, such as hot dogs and fatty cuts of red and white  meats, such as rib eye steak, sausage, ham, and bacon. ? High-fat dairy items, such as whole milk, butter, and cream cheese.  Eat small, frequent meals instead of large meals.  Avoid drinking large amounts of liquid with your meals.  Avoid eating meals during the 2-3 hours before  bedtime.  Avoid lying down right after you eat.  Do not exercise right after you eat. General instructions  Pay attention to any changes in your symptoms.  Take over-the-counter and prescription medicines only as told by your health care provider. Do not take aspirin, ibuprofen, or other NSAIDs unless your health care provider told you to do so.  Do not use any tobacco products, including cigarettes, chewing tobacco, and e-cigarettes. If you need help quitting, ask your health care provider.  Wear loose-fitting clothing. Do not wear anything tight around your waist that causes pressure on your abdomen.  Raise (elevate) the head of your bed 6 inches (15cm).  Try to reduce your stress, such as with yoga or meditation. If you need help reducing stress, ask your health care provider.  If you are overweight, reduce your weight to an amount that is healthy for you. Ask your health care provider for guidance about a safe weight loss goal.  Keep all follow-up visits as told by your health care provider. This is important. Contact a health care provider if:  You have new symptoms.  You have unexplained weight loss.  You have difficulty swallowing, or it hurts to swallow.  You have wheezing or a persistent cough.  Your symptoms do not improve with treatment.  You have a hoarse voice. Get help right away if:  You have pain in your arms, neck, jaw, teeth, or back.  You feel sweaty, dizzy, or light-headed.  You have chest pain or shortness of breath.  You vomit and your vomit looks like blood or coffee grounds.  You faint.  Your stool is bloody or black.  You cannot swallow, drink, or eat. This information is not intended to replace advice given to you by your health care provider. Make sure you discuss any questions you have with your health care provider. Document Released: 06/29/2005 Document Revised: 02/17/2016 Document Reviewed: 01/14/2015 Elsevier Interactive Patient  Education  [employer].

## 2018-04-18 NOTE — Progress Notes (Signed)
Subjective:     Rhonda Pierce is an 27 y.o. female who presents for evaluation of heartburn. This has been associated with belching, chest pain, difficulty swallowing, heartburn, nausea and nocturnal burning. She denies hematemesis, hoarseness, regurgitation of undigested food, shortness of breath, unexpected weight loss and upper abdominal discomfort.  She also complains of nausea, and an acidic taste in her mouth.  Symptoms have been present for 3 days. She denies dysphagia. She has not lost weight. She denies melena, hematochezia, hematemesis, and coffee ground emesis. Medical therapy in the past has included: antacids.  Patient states there is no correlation of her symptoms based on when she eats, but does admit to eating later at night, and states that she did have a hamburger before bed on Saturday.  The following portions of the patient's history were reviewed and updated as appropriate: allergies, current medications and past medical history.  Review of Systems Constitutional: negative Eyes: negative Ears, nose, mouth, throat, and face: negative Respiratory: negative Cardiovascular: negative Gastrointestinal: positive for dyspepsia, nausea, vomiting and See HPI, negative for change in bowel habits, constipation, diarrhea and dysphagia Neurological: negative   Objective:     BP 100/65 (BP Location: Right Arm, Patient Position: Sitting, Cuff Size: Normal)   Pulse 80   Temp 98.2 F (36.8 C) (Oral)   Resp 16   Wt 156 lb 12.8 oz (71.1 kg)   SpO2 99%   BMI 27.78 kg/m  General appearance: alert, cooperative and no distress Head: Normocephalic, without obvious abnormality, atraumatic Eyes: conjunctivae/corneas clear. PERRL, EOM's intact. Fundi benign. Ears: normal TM's and external ear canals both ears Nose: Nares normal. Septum midline. Mucosa normal. No drainage or sinus tenderness. Throat: lips, mucosa, and tongue normal; teeth and gums normal Lungs: clear to auscultation  bilaterally Heart: regular rate and rhythm, S1, S2 normal, no murmur, click, rub or gallop Abdomen: soft, non-tender; bowel sounds normal; no masses,  no organomegaly Pulses: 2+ and symmetric Neurologic: Grossly normal   Assessment:    Gastroesophageal Reflux Disease  Plan:  Exam findings, diagnosis etiology and medication use and indications reviewed with patient. Follow- Up and discharge instructions provided. No emergent/urgent issues found on exam.  Patient verbalized understanding of information provided and agrees with plan of care (POC), all questions answered.  Nonpharmacologic treatments were discussed including: eating smaller meals, elevation of the head of bed at night, avoidance of caffeine, chocolate, nicotine and peppermint, and avoiding tight fitting clothing. Will start a trial of H2 antagonists.    Gastroesophageal reflux disease 1. Gastroesophageal reflux disease, esophagitis presence not specified  - ranitidine (ZANTAC) 150 MG tablet; Take 1 tablet (150 mg total) by mouth 2 (two) times daily.  Dispense: 60 tablet; Refill: 1

## 2018-05-28 ENCOUNTER — Ambulatory Visit (INDEPENDENT_AMBULATORY_CARE_PROVIDER_SITE_OTHER): Payer: Self-pay | Admitting: Family Medicine

## 2018-05-28 ENCOUNTER — Encounter: Payer: Self-pay | Admitting: Family Medicine

## 2018-05-28 VITALS — BP 105/75 | HR 76 | Temp 98.8°F | Resp 16 | Wt 163.4 lb

## 2018-05-28 DIAGNOSIS — D179 Benign lipomatous neoplasm, unspecified: Secondary | ICD-10-CM

## 2018-05-28 DIAGNOSIS — L089 Local infection of the skin and subcutaneous tissue, unspecified: Secondary | ICD-10-CM

## 2018-05-28 DIAGNOSIS — B9689 Other specified bacterial agents as the cause of diseases classified elsewhere: Secondary | ICD-10-CM

## 2018-05-28 MED ORDER — SULFAMETHOXAZOLE-TRIMETHOPRIM 800-160 MG PO TABS: 1.0000 | ORAL_TABLET | Freq: Two times a day (BID) | ORAL | 0 refills | Status: AC

## 2018-05-28 MED FILL — SULFAMETHOXAZOLE-TMP DS TAB: 800-160 | 5 days supply | Qty: 10 | Fill #0

## 2018-05-28 NOTE — Progress Notes (Signed)
Rhonda Pierce is a 27 y.o. female who presents today with concerns of a lesion to her back that she reports new onset of pain. She states the lesion has been present for greater than 1 year and in the last week became tender to palpation. This area was previously examined by her PCP who diagnosed it as lipoma and plan of care is to monitor.  Review of Systems  Constitutional: Negative for chills, fever and malaise/fatigue.  HENT: Negative for congestion, ear discharge, ear pain, sinus pain and sore throat.   Eyes: Negative.   Respiratory: Negative for cough, sputum production and shortness of breath.   Cardiovascular: Negative.  Negative for chest pain.  Gastrointestinal: Negative for abdominal pain, diarrhea, nausea and vomiting.  Genitourinary: Negative for dysuria, frequency, hematuria and urgency.  Musculoskeletal: Negative for myalgias.  Skin: Negative.        Singular lesion that has been present for greater than 12 months- previously diagnosed as lipoma by PCP  Neurological: Negative for headaches.  Endo/Heme/Allergies: Negative.   Psychiatric/Behavioral: Negative.     O: Vitals:   05/28/18 1647  BP: 105/75  Pulse: 76  Resp: 16  Temp: 98.8 F (37.1 C)  SpO2: 98%     Physical Exam  Constitutional: She is oriented to person, place, and time. Vital signs are normal. She appears well-developed and well-nourished. She is active.  Non-toxic appearance. She does not have a sickly appearance.  HENT:  Head: Normocephalic.  Right Ear: Hearing, tympanic membrane, external ear and ear canal normal.  Left Ear: Hearing, tympanic membrane, external ear and ear canal normal.  Nose: Nose normal.  Mouth/Throat: Uvula is midline and oropharynx is clear and moist.  Neck: Normal range of motion. Neck supple.  Cardiovascular: Normal rate, regular rhythm, normal heart sounds and normal pulses.  Pulmonary/Chest: Effort normal and breath sounds normal.  Abdominal: Soft. Bowel sounds are normal.    Musculoskeletal: Normal range of motion.  Lymphadenopathy:       Head (right side): No submental and no submandibular adenopathy present.       Head (left side): No submental and no submandibular adenopathy present.    She has no cervical adenopathy.  Neurological: She is alert and oriented to person, place, and time.  Skin: Lesion and rash noted. No abrasion, no bruising, no burn, no ecchymosis, no laceration, no petechiae and no purpura noted. Rash is nodular. Rash is not macular, not papular, not maculopapular, not pustular, not vesicular and not urticarial. No erythema.     2 cm x 2 cm circular mobile Lesion- area is now tender to palpation non flutulant .  Psychiatric: She has a normal mood and affect.  Vitals reviewed.    A: 1. Lipoma, unspecified site   2. Bacterial skin infection      P: Discussed exam findings, diagnosis etiology and medication use and indications reviewed with patient. Follow- Up and discharge instructions provided. No emergent/urgent issues found on exam.  Patient verbalized understanding of information provided and agrees with plan of care (POC), all questions answered.  1. Lipoma, unspecified site - sulfamethoxazole-trimethoprim (BACTRIM DS,SEPTRA DS) 800-160 MG tablet; Take 1 tablet by mouth 2 (two) times daily for 5 days.  2. Bacterial skin infection - sulfamethoxazole-trimethoprim (BACTRIM DS,SEPTRA DS) 800-160 MG tablet; Take 1 tablet by mouth 2 (two) times daily for 5 days.  Please follow up with PCP for further eval.

## 2018-05-28 NOTE — Patient Instructions (Addendum)
PLAN< Follow up with PCP for evaluation and treatment  Lipoma (2 cm x 2 cm Left upper scapular ridge) A lipoma is a noncancerous (benign) tumor that is made up of fat cells. This is a very common type of soft-tissue growth. Lipomas are usually found under the skin (subcutaneous). They may occur in any tissue of the body that contains fat. Common areas for lipomas to appear include the back, shoulders, buttocks, and thighs. Lipomas grow slowly, and they are usually painless. Most lipomas do not cause problems and do not require treatment. What are the causes? The cause of this condition is not known. What increases the risk? This condition is more likely to develop in:  People who are 54-3 years old.  People who have a family history of lipomas.  What are the signs or symptoms? A lipoma usually appears as a small, round bump under the skin. It may feel soft or rubbery, but the firmness can vary. Most lipomas are not painful. However, a lipoma may become painful if it is located in an area where it pushes on nerves. How is this diagnosed? A lipoma can usually be diagnosed with a physical exam. You may also have tests to confirm the diagnosis and to rule out other conditions. Tests may include:  Imaging tests, such as a CT scan or MRI.  Removal of a tissue sample to be looked at under a microscope (biopsy).  How is this treated? Treatment is not needed for small lipomas that are not causing problems. If a lipoma continues to get bigger or it causes problems, removal is often the best option. Lipomas can also be removed to improve appearance. Removal of a lipoma is usually done with a surgery in which the fatty cells and the surrounding capsule are removed. Most often, a medicine that numbs the area (local anesthetic) is used for this procedure. Follow these instructions at home:  Keep all follow-up visits as directed by your health care provider. This is important. Contact a health care  provider if:  Your lipoma becomes larger or hard.  Your lipoma becomes painful, red, or increasingly swollen. These could be signs of infection or a more serious condition. This information is not intended to replace advice given to you by your health care provider. Make sure you discuss any questions you have with your health care provider. Document Released: 09/09/2002 Document Revised: 02/25/2016 Document Reviewed: 09/15/2014 Elsevier Interactive Patient Education  [employer].

## 2018-05-30 ENCOUNTER — Telehealth: Payer: Self-pay

## 2018-05-30 NOTE — Telephone Encounter (Signed)
Patient called [place] back and states she is doing better, still with some sore in her back.

## 2018-05-30 NOTE — Telephone Encounter (Signed)
I left a message asking the patient if teh have any question or concern they can call us back.

## 2018-06-16 ENCOUNTER — Encounter: Payer: Self-pay | Admitting: Family Medicine

## 2018-06-16 ENCOUNTER — Ambulatory Visit (INDEPENDENT_AMBULATORY_CARE_PROVIDER_SITE_OTHER): Payer: 59 | Admitting: Family Medicine

## 2018-06-16 ENCOUNTER — Other Ambulatory Visit: Payer: Self-pay

## 2018-06-16 VITALS — BP 130/84 | HR 64 | Temp 98.1°F | Ht 63.0 in | Wt 166.0 lb

## 2018-06-16 DIAGNOSIS — L02212 Cutaneous abscess of back [any part, except buttock]: Secondary | ICD-10-CM | POA: Diagnosis not present

## 2018-06-16 DIAGNOSIS — L723 Sebaceous cyst: Secondary | ICD-10-CM

## 2018-06-16 MED ORDER — DOXYCYCLINE HYCLATE 100 MG PO TABS: 100.0000 mg | ORAL_TABLET | Freq: Two times a day (BID) | ORAL | 0 refills | Status: DC

## 2018-06-16 NOTE — Addendum Note (Signed)
Addended by: Kittie Plater, Ashea Winiarski HUA on: 06/16/2018 09:55 AM   Modules accepted: Orders

## 2018-06-16 NOTE — Progress Notes (Signed)
Patient ID: Rhonda Pierce, female    DOB: Jul 27, 1991  Age: 27 y.o. MRN: [id number]  Chief Complaint  Patient presents with  . big bump    on the back on left shoulder. there for a while. now painful for the last couple of weeks     Subjective:   Patient has had a little cyst on her back over the last year but just in the last couple weeks it flared up.  She went to another clinic and was treated with doxycycline.  It calm down, but it is starting to hurt more again.  Current allergies, medications, problem list, past/family and social histories reviewed.  Objective:  BP 130/84 (BP Location: Right Arm, Patient Position: Sitting, Cuff Size: Normal)   Pulse 64   Temp 98.1 F (36.7 C) (Oral)   Ht 5\' 3"  (1.6 m)   Wt 166 lb (75.3 kg)   LMP 06/03/2018 (LMP Unknown)   SpO2 98%   BMI 29.41 kg/m   2-1/2 cm diameter sebaceous cyst left scapular region.  Procedure: Lesion was numbed with 1% lidocaine with epinephrine and bupivacaine.  Patient tolerated this well.  Pap was done.  1.5 cm incision was made.  Large core of watery and thick mucoid material was expressed.  This was cultured.  The capsule was not very thick.  It came out a little pieces but attempted to pull all of it out that I could.  Wound was packed with quarter inch Nu Gauze.  Patient tolerated well.  Was instructed in its care.  Assessment & Plan:   Assessment: 1. Abscess of back   2. Sebaceous cyst       Plan: Culture sent.  Will treat with antibiotics.  See instructions.  No orders of the defined types were placed in this encounter.   Meds ordered this encounter  Medications  . doxycycline (VIBRA-TABS) 100 MG tablet    Sig: Take 1 tablet (100 mg total) by mouth 2 (two) times daily.    Dispense:  14 tablet    Refill:  0         Patient Instructions     Take another 1 week course of doxycycline 1 twice daily for infection  Ibuprofen 200 mg 3 pills 3 times daily or acetaminophen 500 mg 2 pills 3 times  daily if needed for pain.  Culture is pending  Try to keep it dry for the next few days.    Return in 3 days for packing to be removed and/or changed  If you have lab work done today you will be contacted with your lab results within the next 2 weeks.  If you have not heard from [place] then please contact us. The fastest way to get your results is to register for My Chart.   IF you received an x-ray today, you will receive an invoice from [facility]. Please contact [facility] at [phone] with questions or concerns regarding your invoice.   IF you received labwork today, you will receive an invoice from [place]. Please contact [facility] at [phone] with questions or concerns regarding your invoice.   Our billing staff will not be able to assist you with questions regarding bills from these companies.  You will be contacted with the lab results as soon as they are available. The fastest way to get your results is to activate your My Chart account. Instructions are located on the last page of this paperwork. If you have not heard from [place] regarding the results  in 2 weeks, please contact this office.         Return in about 3 years (around 06/16/2021) for Packing change from [employer].   Ruben Reason, MD 9/14/2019Patient ID: Rhonda Pierce, female    DOB: 07-03-1991  Age: 27 y.o. MRN: [id number]  Chief Complaint  Patient presents with  . big bump    on the back on left shoulder. there for a while. now painful for the last couple of weeks

## 2018-06-16 NOTE — Patient Instructions (Addendum)
   Take another 1 week course of doxycycline 1 twice daily for infection  Ibuprofen 200 mg 3 pills 3 times daily or acetaminophen 500 mg 2 pills 3 times daily if needed for pain.  Culture is pending  Try to keep it dry for the next few days.    Return in 3 days for packing to be removed and/or changed  If you have lab work done today you will be contacted with your lab results within the next 2 weeks.  If you have not heard from [place] then please contact us. The fastest way to get your results is to register for My Chart.   IF you received an x-ray today, you will receive an invoice from [facility]. Please contact [facility] at [phone] with questions or concerns regarding your invoice.   IF you received labwork today, you will receive an invoice from [place]. Please contact [facility] at [phone] with questions or concerns regarding your invoice.   Our billing staff will not be able to assist you with questions regarding bills from these companies.  You will be contacted with the lab results as soon as they are available. The fastest way to get your results is to activate your My Chart account. Instructions are located on the last page of this paperwork. If you have not heard from [place] regarding the results in 2 weeks, please contact this office.

## 2018-06-18 ENCOUNTER — Ambulatory Visit (INDEPENDENT_AMBULATORY_CARE_PROVIDER_SITE_OTHER): Payer: 59 | Admitting: Family Medicine

## 2018-06-18 ENCOUNTER — Other Ambulatory Visit: Payer: Self-pay

## 2018-06-18 ENCOUNTER — Encounter: Payer: Self-pay | Admitting: Family Medicine

## 2018-06-18 VITALS — BP 118/72

## 2018-06-18 DIAGNOSIS — L723 Sebaceous cyst: Secondary | ICD-10-CM

## 2018-06-18 NOTE — Patient Instructions (Signed)
° ° ° °  If you have lab work done today you will be contacted with your lab results within the next 2 weeks.  If you have not heard from us then please contact us. The fastest way to get your results is to register for My Chart. ° ° °IF you received an x-ray today, you will receive an invoice from [place] [facility]. Please contact [facility] at [phone] with questions or concerns regarding your invoice.  ° °IF you received labwork today, you will receive an invoice from [facility]. Please contact [facility] at [phone] with questions or concerns regarding your invoice.  ° °Our billing staff will not be able to assist you with questions regarding bills from these companies. ° °You will be contacted with the lab results as soon as they are available. The fastest way to get your results is to activate your My Chart account. Instructions are located on the last page of this paperwork. If you have not heard from us regarding the results in 2 weeks, please contact this office. °  ° ° ° °

## 2018-06-18 NOTE — Progress Notes (Addendum)
Subjective:  By signing my name below, I, Rhonda Pierce, attest that this documentation has been prepared under the direction and in the presence of Rhonda Agreste, MD Electronically Signed: Ladene Artist, ED Scribe 06/18/2018 at 5:48 PM.   Patient ID: Rhonda Pierce, female    DOB: 1991-07-04, 27 y.o.   MRN: [id number]  Chief Complaint  Patient presents with  . Wound Check    follow-up from 9/14 for repack   HPI Rhonda Pierce is a 27 y.o. female who presents to Primary Care at [facility] for f/u of sebaceous cyst of back I&D perform 2 days ago. Started on doxycyline, packing was placed. Here for packing removal. - Pt noticed some draining from the cyst this morning upon waking and had the dressing changed while at work. She is still having some pain from the site. Pt has been taking ibuprofen daily. Denies fever.  Patient Active Problem List   Diagnosis Date Noted  . Vaginal delivery 06/08/2016  . Fall 04/08/2016  . BV (bacterial vaginosis) 04/08/2016  . 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 Anemia affecting pregnancy 03/10/2016   Past Medical History:  Diagnosis Date  . Medical history non-contributory    Past Surgical History:  Procedure Laterality Date  . NO PAST SURGERIES    . THERAPEUTIC ABORTION     No Known Allergies Prior to Admission medications   Medication Sig Start Date End Date Taking? Authorizing Provider  doxycycline (VIBRA-TABS) 100 MG tablet Take 1 tablet (100 mg total) by mouth 2 (two) times daily. 06/16/18   Posey Boyer, MD  ibuprofen (ADVIL,MOTRIN) 200 MG tablet Take 200 mg by mouth every 6 (six) hours as needed for moderate pain.    [provider]  phentermine (ADIPEX-P) 37.5 MG tablet phentermine 37.5 mg tablet  TAKE 1 TABLET BY MOUTH EVERY MORNING    [provider]  [place] 28 0.25-35 MG-MCG tablet Take 1 tablet by mouth daily. 02/04/18   [provider]   Social History   Socioeconomic History  .  Marital status: Single    Spouse name: Not on file  . Number of children: Not on file  . Years of education: Not on file  . Highest education level: Not on file  Occupational History  . Not on file  Social Needs  . Financial resource strain: Not on file  . Food insecurity:    Worry: Not on file    Inability: Not on file  . Transportation needs:    Medical: Not on file    Non-medical: Not on file  Tobacco Use  . Smoking status: Former Smoker    Types: Cigarettes  . Smokeless tobacco: Never Used  . Tobacco comment: occ smoker; quit with preg  Substance and Sexual Activity  . Alcohol use: No    Alcohol/week: 0.0 standard drinks  . Drug use: No  . Sexual activity: Yes    Birth control/protection: None  Lifestyle  . Physical activity:    Days per week: Not on file    Minutes per session: Not on file  . Stress: Not on file  Relationships  . Social connections:    Talks on phone: Not on file    Gets together: Not on file    Attends religious service: Not on file    Active member of club or organization: Not on file    Attends meetings of clubs or organizations: Not on file    Relationship status: Not on  file  . Intimate partner violence:    Fear of current or ex partner: Not on file    Emotionally abused: Not on file    Physically abused: Not on file    Forced sexual activity: Not on file  Other Topics Concern  . Not on file  Social History Narrative  . Not on file   Review of Systems  Constitutional: Negative for fever.  Skin: Positive for wound.      Objective:   Physical Exam  Constitutional: She is oriented to person, place, and time. She appears well-developed and well-nourished. No distress.  HENT:  Head: Normocephalic and atraumatic.  Eyes: Conjunctivae and EOM are normal.  Neck: Neck supple. No tracheal deviation present.  Cardiovascular: Normal rate.  Pulmonary/Chest: Effort normal. No respiratory distress.  Musculoskeletal: Normal range of motion.    Neurological: She is alert and oriented to person, place, and time.  Skin: Skin is warm and dry.  Packing intact and removed. No surrounding erythema. Minimal adherent exudate to packing ~35-40 cm.  Psychiatric: She has a normal mood and affect. Her behavior is normal.  Nursing note and vitals reviewed.  Vitals:   06/18/18 1715  BP: 118/72      Assessment & Plan:  Rhonda Pierce is a 27 y.o. female Sebaceous cyst  -Tolerating antibiotic, packing removed and replaced without difficulty by Dewitt Hoes, PA-C, see separate note.   No orders of the defined types were placed in this encounter.  Patient Instructions       If you have lab work done today you will be contacted with your lab results within the next 2 weeks.  If you have not heard from [place] then please contact us. The fastest way to get your results is to register for My Chart.   IF you received an x-ray today, you will receive an invoice from [facility]. Please contact [facility]iology at [phone] with questions or concerns regarding your invoice.   IF you received labwork today, you will receive an invoice from [place]. Please contact [facility] at [phone] with questions or concerns regarding your invoice.   Our billing staff will not be able to assist you with questions regarding bills from these companies.  You will be contacted with the lab results as soon as they are available. The fastest way to get your results is to activate your My Chart account. Instructions are located on the last page of this paperwork. If you have not heard from [place] regarding the results in 2 weeks, please contact this office.      I personally performed the services described in this documentation, which was scribed in my presence. The recorded information has been reviewed and considered for accuracy and completeness, addended by me as needed, and agree with information above.  Signed,   Merri Ray, MD Primary  Care at [place].  06/19/18 4:32 PM

## 2018-06-19 LAB — WOUND CULTURE: ORGANISM ID, BACTERIA: NONE SEEN

## 2018-06-21 ENCOUNTER — Encounter: Payer: Self-pay | Admitting: Family Medicine

## 2018-06-22 NOTE — Progress Notes (Signed)
Wound healing as expected. Beefy granulation tissue present. Wound <1cm in depth. No purulent drainage with deep palpation. Lightly repacked. Wound dressed. She can take packing out after 48 hours.

## 2018-07-06 ENCOUNTER — Other Ambulatory Visit: Payer: Self-pay | Admitting: Family Medicine

## 2018-07-06 NOTE — Telephone Encounter (Signed)
Patient called and she says she just spoke to her OBGYN who will be prescribing it for her.

## 2018-07-06 NOTE — Telephone Encounter (Signed)
Copied from [place] [phone]. Topic: Quick Communication - Rx Refill/Question >> Jul 06, 2018  9:48 AM Leward Quan A wrote: Medication: [place] 28 0.25-35 MG-MCG tablet  Patient is all out did not realize there are no more refills. Would like to have it today  Has the patient contacted their pharmacy?  Yes   Preferred Pharmacy (with phone number or street name): [id number] [address], [place]. [phone] (Phone) [phone] (Fax)    Agent: Please be advised that RX refills may take up to 3 business days. We ask that you follow-up with your pharmacy.

## 2018-07-06 NOTE — Telephone Encounter (Signed)
Attempted to contact pt regarding refill request for sprintec; medication not written by a physician at [facility]; left message on voicemail [phone]; when pt calls back please verify prescribing physician.

## 2018-07-06 NOTE — Telephone Encounter (Signed)
Rhonda Pierce called back and and said that she is not sure who prescribed for her.  She said what does she need to do? It might have been her ob/gyn?  Please advise> Patient would like a call back.

## 2018-07-12 ENCOUNTER — Ambulatory Visit: Payer: 59 | Admitting: Family Medicine

## 2018-07-14 ENCOUNTER — Ambulatory Visit: Payer: 59 | Admitting: Family Medicine

## 2018-07-14 ENCOUNTER — Encounter: Payer: Self-pay | Admitting: Family Medicine

## 2018-07-14 VITALS — BP 119/85 | HR 86 | Temp 99.1°F | Resp 18 | Ht 63.0 in | Wt 171.6 lb

## 2018-07-14 DIAGNOSIS — S21202D Unspecified open wound of left back wall of thorax without penetration into thoracic cavity, subsequent encounter: Secondary | ICD-10-CM

## 2018-07-14 NOTE — Patient Instructions (Addendum)
   I do not see a reason to cut into the wound again at this time.  It appears to be healing, and should continue to improve over the next few weeks.  Itching may be related to either the bandage or some of the healing.  You can apply some over-the-counter hydrocortisone cream to the itching area if needed up to twice per day.  Continue to keep area clean with soap and water, avoid bandages or Neosporin at this time as that may cause irritation.  If there are any worsening symptoms please return for recheck this week.  Thank you for coming in today.  Return to the clinic or go to the nearest emergency room if any of your symptoms worsen or new symptoms occur.    If you have lab work done today you will be contacted with your lab results within the next 2 weeks.  If you have not heard from [place] then please contact us. The fastest way to get your results is to register for My Chart.   IF you received an x-ray today, you will receive an invoice from [facility]. Please contact [facility] Radiology at [phone] with questions or concerns regarding your invoice.   IF you received labwork today, you will receive an invoice from [place]. Please contact [facility] at [phone] with questions or concerns regarding your invoice.   Our billing staff will not be able to assist you with questions regarding bills from these companies.  You will be contacted with the lab results as soon as they are available. The fastest way to get your results is to activate your My Chart account. Instructions are located on the last page of this paperwork. If you have not heard from [place] regarding the results in 2 weeks, please contact this office.

## 2018-07-14 NOTE — Progress Notes (Signed)
Subjective:    Patient ID: Rhonda Pierce, female    DOB: [birth year], 27 y.o.   MRN: [id number]  HPI Rhonda Pierce is a 27 y.o. female Presents today for: Chief Complaint  Patient presents with  . cyst    recheck cyst on her left shoulder; little painful   Here for follow-up of sebaceous cyst, I&D performed September 14.  Was started on doxycycline, packing removed at that time.   Has not been seen since 9/16.  Using neosporin and cleaning around wound, kept covered until it closed.  Has left open past few weeks.  Feels a little more sore past few days, mostly itchy now.  No recent discharge. Still applying neosporin.    Patient Active Problem List   Diagnosis Date Noted  . Vaginal delivery 06/08/2016  . Fall 04/08/2016  . BV (bacterial vaginosis) 04/08/2016  . 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 Anemia affecting pregnancy 03/10/2016   Past Medical History:  Diagnosis Date  . Medical history non-contributory    Past Surgical History:  Procedure Laterality Date  . NO PAST SURGERIES    . THERAPEUTIC ABORTION     No Known Allergies Prior to Admission medications   Medication Sig Start Date End Date Taking? Authorizing Provider  ibuprofen (ADVIL,MOTRIN) 200 MG tablet Take 200 mg by mouth every 6 (six) hours as needed for moderate pain.   Yes [provider]  phentermine (ADIPEX-P) 37.5 MG tablet phentermine 37.5 mg tablet  TAKE 1 TABLET BY MOUTH EVERY MORNING   Yes [provider]  [place] 28 0.25-35 MG-MCG tablet Take 1 tablet by mouth daily. 02/04/18  Yes [provider]   Social History   Socioeconomic History  . Marital status: Single    Spouse name: Not on file  . Number of children: Not on file  . Years of education: Not on file  . Highest education level: Not on file  Occupational History  . Not on file  Social Needs  . Financial resource strain: Not on file  . Food insecurity:    Worry: Not on file   Inability: Not on file  . Transportation needs:    Medical: Not on file    Non-medical: Not on file  Tobacco Use  . Smoking status: Former Smoker    Types: Cigarettes  . Smokeless tobacco: Never Used  . Tobacco comment: occ smoker; quit with preg  Substance and Sexual Activity  . Alcohol use: No    Alcohol/week: 0.0 standard drinks  . Drug use: No  . Sexual activity: Yes    Birth control/protection: None  Lifestyle  . Physical activity:    Days per week: Not on file    Minutes per session: Not on file  . Stress: Not on file  Relationships  . Social connections:    Talks on phone: Not on file    Gets together: Not on file    Attends religious service: Not on file    Active member of club or organization: Not on file    Attends meetings of clubs or organizations: Not on file    Relationship status: Not on file  . Intimate partner violence:    Fear of current or ex partner: Not on file    Emotionally abused: Not on file    Physically abused: Not on file    Forced sexual activity: Not on file  Other Topics Concern  . Not on file  Social History Narrative  .  Not on file    Review of Systems     Objective:   Physical Exam  Constitutional: She is oriented to person, place, and time. She appears well-developed and well-nourished. No distress.  HENT:  Head: Normocephalic and atraumatic.  Cardiovascular: Normal rate.  Pulmonary/Chest: Effort normal.  Musculoskeletal: She exhibits edema.  Neurological: She is alert and oriented to person, place, and time.  Skin:     Psychiatric: She has a normal mood and affect.  Vitals reviewed.  Vitals:   07/14/18 1004  BP: 119/85  Pulse: 86  Resp: 18  Temp: 99.1 F (37.3 C)  TempSrc: Oral  SpO2: 100%  Weight: 171 lb 9.6 oz (77.8 kg)  Height: 5\' 3"  (1.6 m)       Assessment & Plan:  Rhonda Pierce is a 27 y.o. female Wound of left side of back, subsequent encounter  -prior sebaceous cyst status post I&D.  Wound appears to  be healing, no sign of secondary infection or significant induration/fluctuance at this time to warrant repeat incision.  Itching slight irritation around the wound may be related to bandage or possible Neosporin.  Could also be part of healing process.  -Stop Neosporin, bandages, cleanse with soap and water, over-the-counter hydrocortisone if needed for itching, recheck if not improving this week.  Sooner if worse.  Discussed potential long-term scar, but should have some improvement in thickening and current symptoms.  No orders of the defined types were placed in this encounter.  Patient Instructions     I do not see a reason to cut into the wound again at this time.  It appears to be healing, and should continue to improve over the next few weeks.  Itching may be related to either the bandage or some of the healing.  You can apply some over-the-counter hydrocortisone cream to the itching area if needed up to twice per day.  Continue to keep area clean with soap and water, avoid bandages or Neosporin at this time as that may cause irritation.  If there are any worsening symptoms please return for recheck this week.  Thank you for coming in today.  Return to the clinic or go to the nearest emergency room if any of your symptoms worsen or new symptoms occur.    If you have lab work done today you will be contacted with your lab results within the next 2 weeks.  If you have not heard from [place] then please contact us. The fastest way to get your results is to register for My Chart.   IF you received an x-ray today, you will receive an invoice from [facility]iology. Please contact [facility] Radiology at [phone] with questions or concerns regarding your invoice.   IF you received labwork today, you will receive an invoice from [place]. Please contact [facility] at [phone] with questions or concerns regarding your invoice.   Our billing staff will not be able to assist you with questions  regarding bills from these companies.  You will be contacted with the lab results as soon as they are available. The fastest way to get your results is to activate your My Chart account. Instructions are located on the last page of this paperwork. If you have not heard from [place] regarding the results in 2 weeks, please contact this office.       Signed,   Merri Ray, MD Primary Care at [place].  07/14/18 10:43 AM

## 2018-07-20 ENCOUNTER — Ambulatory Visit: Payer: 59 | Admitting: Physician Assistant

## 2018-08-01 DIAGNOSIS — Z6832 Body mass index (BMI) 32.0-32.9, adult: Secondary | ICD-10-CM | POA: Diagnosis not present

## 2018-08-01 DIAGNOSIS — Z3040 Encounter for surveillance of contraceptives, unspecified: Secondary | ICD-10-CM | POA: Diagnosis not present

## 2018-08-01 DIAGNOSIS — N9089 Other specified noninflammatory disorders of vulva and perineum: Secondary | ICD-10-CM | POA: Diagnosis not present

## 2018-08-01 DIAGNOSIS — Z01419 Encounter for gynecological examination (general) (routine) without abnormal findings: Secondary | ICD-10-CM | POA: Diagnosis not present

## 2018-08-01 DIAGNOSIS — Z113 Encounter for screening for infections with a predominantly sexual mode of transmission: Secondary | ICD-10-CM | POA: Diagnosis not present

## 2018-08-27 DIAGNOSIS — H5213 Myopia, bilateral: Secondary | ICD-10-CM | POA: Diagnosis not present

## 2018-09-06 ENCOUNTER — Ambulatory Visit (INDEPENDENT_AMBULATORY_CARE_PROVIDER_SITE_OTHER): Payer: Self-pay | Admitting: Nurse Practitioner

## 2018-09-06 VITALS — BP 110/72 | HR 72 | Temp 98.6°F | Wt 178.0 lb

## 2018-09-06 DIAGNOSIS — R059 Cough, unspecified: Secondary | ICD-10-CM

## 2018-09-06 DIAGNOSIS — B9689 Other specified bacterial agents as the cause of diseases classified elsewhere: Secondary | ICD-10-CM

## 2018-09-06 DIAGNOSIS — R05 Cough: Secondary | ICD-10-CM

## 2018-09-06 DIAGNOSIS — J019 Acute sinusitis, unspecified: Secondary | ICD-10-CM

## 2018-09-06 MED ORDER — PROMETHAZINE-DM 6.25-15 MG/5ML PO SYRP: 5.0000 mL | ORAL_SOLUTION | Freq: Four times a day (QID) | ORAL | 0 refills | Status: AC | PRN

## 2018-09-06 MED ORDER — FLUTICASONE PROPIONATE 50 MCG/ACT NA SUSP: 2.0000 | Freq: Every day | NASAL | 0 refills | Status: DC

## 2018-09-06 MED ORDER — AMOXICILLIN-POT CLAVULANATE 875-125 MG PO TABS: 1.0000 | ORAL_TABLET | Freq: Two times a day (BID) | ORAL | 0 refills | Status: AC

## 2018-09-06 MED ORDER — BENZONATATE 200 MG PO CAPS: 200.0000 mg | ORAL_CAPSULE | Freq: Three times a day (TID) | ORAL | 0 refills | Status: AC | PRN

## 2018-09-06 MED FILL — AMOX-CLAV 875-125 MG TABLET: 875-125 | 10 days supply | Qty: 20 | Fill #0

## 2018-09-06 MED FILL — BENZONATATE 200 MG CAPS: 200 | 10 days supply | Qty: 30 | Fill #0

## 2018-09-06 MED FILL — FLUTICASONE PROP 50 MCG SPR: 50 | 30 days supply | Qty: 16 | Fill #0

## 2018-09-06 MED FILL — PROMETHAZINE W/DM SYRUP: 6.25-15 | 5 days supply | Qty: 100 | Fill #0

## 2018-09-06 NOTE — Progress Notes (Signed)
Subjective:    Patient ID: Rhonda Pierce, female    DOB: [birth year], 27 y.o.   MRN: [id number]  The patient is a 27 year old female who presents today for complaints of "a cold for several weeks".  Patient states that all this started out as a cold but over the last few few days she has developed headache, facial pressure, nasal congestion, and cough.  The patient denies fever, ear pain or ear drainage, sore throat, or nausea and vomiting.  The patient states the cough has worsened since the onset and it also keeps her awake at night.  Patient denies wheezing, but does have some shortness of breath.  Patient does have a history of seasonal allergies.  Patient states she has Flonase that she used today, which did help her symptoms.  Sinusitis  This is a new problem. The current episode started 1 to 4 weeks ago. The problem is unchanged. There has been no fever. She is experiencing no pain. Associated symptoms include congestion, coughing, headaches, shortness of breath, sinus pressure and a sore throat. (Bilateral ear pressure/fullness) Past treatments include nothing.   Past Medical History:  Diagnosis Date  . Medical history non-contributory     Review of Systems  Constitutional: Positive for appetite change and fatigue.  HENT: Positive for congestion, postnasal drip, sinus pressure, sinus pain and sore throat.   Eyes: Negative.   Respiratory: Positive for cough and shortness of breath.   Cardiovascular: Negative.   Gastrointestinal: Positive for nausea.  Skin: Negative.   Allergic/Immunologic: Negative for environmental allergies.  Neurological: Positive for headaches. Negative for dizziness, facial asymmetry, light-headedness and numbness.       Objective:   Physical Exam  Constitutional: She is oriented to person, place, and time. She appears well-developed and well-nourished. No distress.  HENT:  Head: Normocephalic.  Right Ear: External ear normal.  Left Ear: External ear  normal.  Mouth/Throat: Uvula is midline, oropharynx is clear and moist and mucous membranes are normal. No oropharyngeal exudate, posterior oropharyngeal edema or posterior oropharyngeal erythema.  +frontal sinus tenderness bilaterally, mild maxillary sinus pressure bilaterally  Eyes: Pupils are equal, round, and reactive to light.  Neck: Normal range of motion. Neck supple. No tracheal deviation present. No thyromegaly present.  Cardiovascular: Normal rate and regular rhythm.  Pulmonary/Chest: Effort normal and breath sounds normal. No respiratory distress. She has no wheezes.  Abdominal: Soft. Bowel sounds are normal.  Neurological: She is alert and oriented to person, place, and time. No cranial nerve deficit.  Skin: Skin is warm and dry. Capillary refill takes less than 2 seconds.  Psychiatric: She has a normal mood and affect.  Vitals reviewed.     Assessment & Plan:   Exam findings, diagnosis etiology and medication use and indications reviewed with patient. Follow- Up and discharge instructions provided. No emergent/urgent issues found on exam.  Patient was diagnosed with an acute bacterial infection.  Patient was prescribed antibiotics based on symptomology, and duration of symptoms.  There were no signs indicative of sinus problems that may require imaging.  Patient also given medications to help with cough.  Cough seems to be a symptom of her sinusitis.  Patient education was provided. Patient verbalized understanding of information provided and agrees with plan of care (POC), all questions answered. The patient is advised to call or return to clinic if condition does not see an improvement in symptoms, or to seek the care of the closest emergency department if condition worsens with the above plan.  1. Acute bacterial sinusitis  - amoxicillin-clavulanate (AUGMENTIN) 875-125 MG tablet; Take 1 tablet by mouth 2 (two) times daily for 10 days.  Dispense: 20 tablet; Refill: 0 -  fluticasone (FLONASE) 50 MCG/ACT nasal spray; Place 2 sprays into both nostrils daily for 10 days.  Dispense: 16 g; Refill: 0  2. Cough  - promethazine-dextromethorphan (PROMETHAZINE-DM) 6.25-15 MG/5ML syrup; Take 5 mLs by mouth 4 (four) times daily as needed for up to 5 days for cough.  Dispense: 100 mL; Refill: 0 - benzonatate (TESSALON) 200 MG capsule; Take 1 capsule (200 mg total) by mouth 3 (three) times daily as needed for up to 10 days for cough.  Dispense: 30 capsule; Refill: 0

## 2018-09-06 NOTE — Patient Instructions (Signed)
Cough, Adult Coughing is a reflex that clears your throat and your airways. Coughing helps to heal and protect your lungs. It is normal to cough occasionally, but a cough that happens with other symptoms or lasts a long time may be a sign of a condition that needs treatment. A cough may last only 2-3 weeks (acute), or it may last longer than 8 weeks (chronic). What are the causes? Coughing is commonly caused by:  Breathing in substances that irritate your lungs.  A viral or bacterial respiratory infection.  Allergies.  Asthma.  Postnasal drip.  Smoking.  Acid backing up from the stomach into the esophagus (gastroesophageal reflux).  Certain medicines.  Chronic lung problems, including COPD (or rarely, lung cancer).  Other medical conditions such as heart failure.  Follow these instructions at home: Pay attention to any changes in your symptoms. Take these actions to help with your discomfort:  Take medicines only as told by your health care provider. ? If you were prescribed an antibiotic medicine, take it as told by your health care provider. Do not stop taking the antibiotic even if you start to feel better. ? Talk with your health care provider before you take a cough suppressant medicine.  Drink enough fluid to keep your urine clear or pale yellow.  If the air is dry, use a cold steam vaporizer or humidifier in your bedroom or your home to help loosen secretions.  Avoid anything that causes you to cough at work or at home.  If your cough is worse at night, try sleeping in a semi-upright position.  Avoid cigarette smoke. If you smoke, quit smoking. If you need help quitting, ask your health care provider.  Avoid caffeine.  Avoid alcohol.  Rest as needed.  Contact a health care provider if:  You have new symptoms.  You cough up pus.  Your cough does not get better after 2-3 weeks, or your cough gets worse.  You cannot control your cough with suppressant  medicines and you are losing sleep.  You develop pain that is getting worse or pain that is not controlled with pain medicines.  You have a fever.  You have unexplained weight loss.  You have night sweats. Get help right away if:  You cough up blood.  You have difficulty breathing.  Your heartbeat is very fast. This information is not intended to replace advice given to you by your health care provider. Make sure you discuss any questions you have with your health care provider. Document Released: 03/18/2011 Document Revised: 02/25/2016 Document Reviewed: 11/26/2014 Elsevier Interactive Patient Education  2018 [employer].  Sinusitis, Adult Sinusitis is soreness and inflammation of your sinuses. Sinuses are hollow spaces in the bones around your face. Your sinuses are located:  Around your eyes.  In the middle of your forehead.  Behind your nose.  In your cheekbones.  Your sinuses and nasal passages are lined with a stringy fluid (mucus). Mucus normally drains out of your sinuses. When your nasal tissues become inflamed or swollen, the mucus can become trapped or blocked so air cannot flow through your sinuses. This allows bacteria, viruses, and funguses to grow, which leads to infection. Sinusitis can develop quickly and last for 7?10 days (acute) or for more than 12 weeks (chronic). Sinusitis often develops after a cold. What are the causes? This condition is caused by anything that creates swelling in the sinuses or stops mucus from draining, including:  Allergies.  Asthma.  Bacterial or viral infection.  Abnormally shaped bones between the nasal passages.  Nasal growths that contain mucus (nasal polyps).  Narrow sinus openings.  Pollutants, such as chemicals or irritants in the air.  A foreign object stuck in the nose.  A fungal infection. This is rare.  What increases the risk? The following factors may make you more likely to develop this  condition:  Having allergies or asthma.  Having had a recent cold or respiratory tract infection.  Having structural deformities or blockages in your nose or sinuses.  Having a weak immune system.  Doing a lot of swimming or diving.  Overusing nasal sprays.  Smoking.  What are the signs or symptoms? The main symptoms of this condition are pain and a feeling of pressure around the affected sinuses. Other symptoms include:  Upper toothache.  Earache.  Headache.  Bad breath.  Decreased sense of smell and taste.  A cough that may get worse at night.  Fatigue.  Fever.  Thick drainage from your nose. The drainage is often green and it may contain pus (purulent).  Stuffy nose or congestion.  Postnasal drip. This is when extra mucus collects in the throat or back of the nose.  Swelling and warmth over the affected sinuses.  Sore throat.  Sensitivity to light.  How is this diagnosed? This condition is diagnosed based on symptoms, a medical history, and a physical exam. To find out if your condition is acute or chronic, your health care provider may:  Look in your nose for signs of nasal polyps.  Tap over the affected sinus to check for signs of infection.  View the inside of your sinuses using an imaging device that has a light attached (endoscope).  If your health care provider suspects that you have chronic sinusitis, you may also:  Be tested for allergies.  Have a sample of mucus taken from your nose (nasal culture) and checked for bacteria.  Have a mucus sample examined to see if your sinusitis is related to an allergy.  If your sinusitis does not respond to treatment and it lasts longer than 8 weeks, you may have an MRI or CT scan to check your sinuses. These scans also help to determine how severe your infection is. In rare cases, a bone biopsy may be done to rule out more serious types of fungal sinus disease. How is this treated? Treatment for  sinusitis depends on the cause and whether your condition is chronic or acute. If a virus is causing your sinusitis, your symptoms will go away on their own within 10 days. You may be given medicines to relieve your symptoms, including:  Topical nasal decongestants. They shrink swollen nasal passages and let mucus drain from your sinuses.  Antihistamines. These drugs block inflammation that is triggered by allergies. This can help to ease swelling in your nose and sinuses.  Topical nasal corticosteroids. These are nasal sprays that ease inflammation and swelling in your nose and sinuses.  Nasal saline washes. These rinses can help to get rid of thick mucus in your nose.  If your condition is caused by bacteria, you will be given an antibiotic medicine. If your condition is caused by a fungus, you will be given an antifungal medicine. Surgery may be needed to correct underlying conditions, such as narrow nasal passages. Surgery may also be needed to remove polyps. Follow these instructions at home: Medicines  Take, use, or apply over-the-counter and prescription medicines only as told by your health care provider. These may include  nasal sprays.  If you were prescribed an antibiotic medicine, take it as told by your health care provider. Do not stop taking the antibiotic even if you start to feel better. Hydrate and Humidify  Drink enough water to keep your urine clear or pale yellow. Staying hydrated will help to thin your mucus.  Use a cool mist humidifier to keep the humidity level in your home above 50%.  Inhale steam for 10-15 minutes, 3-4 times a day or as told by your health care provider. You can do this in the bathroom while a hot shower is running.  Limit your exposure to cool or dry air. Rest  Rest as much as possible.  Sleep with your head raised (elevated).  Make sure to get enough sleep each night. General instructions  Apply a warm, moist washcloth to your face 3-4  times a day or as told by your health care provider. This will help with discomfort.  Wash your hands often with soap and water to reduce your exposure to viruses and other germs. If soap and water are not available, use hand sanitizer.  Do not smoke. Avoid being around people who are smoking (secondhand smoke).  Keep all follow-up visits as told by your health care provider. This is important. Contact a health care provider if:  You have a fever.  Your symptoms get worse.  Your symptoms do not improve within 10 days. Get help right away if:  You have a severe headache.  You have persistent vomiting.  You have pain or swelling around your face or eyes.  You have vision problems.  You develop confusion.  Your neck is stiff.  You have trouble breathing. This information is not intended to replace advice given to you by your health care provider. Make sure you discuss any questions you have with your health care provider. Document Released: 09/19/2005 Document Revised: 05/15/2016 Document Reviewed: 07/15/2015 Elsevier Interactive Patient Education  [employer].

## 2018-09-24 DIAGNOSIS — Z79899 Other long term (current) drug therapy: Secondary | ICD-10-CM | POA: Diagnosis not present

## 2018-09-24 DIAGNOSIS — J302 Other seasonal allergic rhinitis: Secondary | ICD-10-CM | POA: Diagnosis not present

## 2018-09-24 DIAGNOSIS — E7800 Pure hypercholesterolemia, unspecified: Secondary | ICD-10-CM | POA: Diagnosis not present

## 2018-09-24 DIAGNOSIS — E559 Vitamin D deficiency, unspecified: Secondary | ICD-10-CM | POA: Diagnosis not present

## 2018-10-02 DIAGNOSIS — R945 Abnormal results of liver function studies: Secondary | ICD-10-CM | POA: Diagnosis not present

## 2018-10-25 ENCOUNTER — Encounter: Payer: Self-pay | Admitting: Nurse Practitioner

## 2018-10-25 ENCOUNTER — Ambulatory Visit (INDEPENDENT_AMBULATORY_CARE_PROVIDER_SITE_OTHER): Payer: No Typology Code available for payment source | Admitting: Nurse Practitioner

## 2018-10-25 VITALS — BP 112/80 | HR 77 | Temp 98.1°F | Ht 63.0 in | Wt 173.0 lb

## 2018-10-25 DIAGNOSIS — E785 Hyperlipidemia, unspecified: Secondary | ICD-10-CM | POA: Diagnosis not present

## 2018-10-25 DIAGNOSIS — R748 Abnormal levels of other serum enzymes: Secondary | ICD-10-CM

## 2018-10-25 DIAGNOSIS — R739 Hyperglycemia, unspecified: Secondary | ICD-10-CM

## 2018-10-25 NOTE — Progress Notes (Signed)
Subjective:  Patient ID: Rhonda Pierce, female    DOB: [birth year]  Age: 28 y.o. MRN: [id number]  CC: Establish Care (est care/ last primary care said she had high cholesterol and elevated liver enzymes/ wanting to discuss abdominal scan?/ not fastin)  HPI Rhonda Pierce is here today to discuss lab results from previous pcp. She was told she had elevated liver enzymes and abnormal lipid panel. FHx of elevated liver enzymes (father, mother, and sister). Parents are born and raised in [place]. She is born and raised here. Denies any ETOH use, or drug use. No ABD pain or nausea or weight loss or change in BM. Gallbladder still present.  previous pcp with [facility], last seen 09/2018.  Phentermine use intermittently (prescribed by previous pcp), first use 84months ago for 40months, then stopped for 17months. Resumed medication 2weeks ago.  GYN: Dr. Cletis Media [facility]) Use of condoms. Last PAP last year (normal per patient)  Reviewed past Medical, Social and Family history today.  Outpatient Medications Prior to Visit  Medication Sig Dispense Refill  . fluticasone (FLONASE) 50 MCG/ACT nasal spray Place 2 sprays into both nostrils daily for 10 days. 16 g 0  . ibuprofen (ADVIL,MOTRIN) 200 MG tablet Take 200 mg by mouth every 6 (six) hours as needed for moderate pain.    . phentermine (ADIPEX-P) 37.5 MG tablet phentermine 37.5 mg tablet  TAKE 1 TABLET BY MOUTH EVERY MORNING    . SPRINTEC 28 0.25-35 MG-MCG tablet Take 1 tablet by mouth daily.  12   No facility-administered medications prior to visit.     ROS See HPI  Objective:  BP 112/80   Pulse 77   Temp 98.1 F (36.7 C) (Oral)   Ht 5\' 3"  (1.6 m)   Wt 173 lb (78.5 kg)   SpO2 100%   BMI 30.65 kg/m   BP Readings from Last 3 Encounters:  10/25/18 112/80  09/06/18 110/72  07/14/18 119/85    Wt Readings from Last 3 Encounters:  10/25/18 173 lb (78.5 kg)  09/06/18 178 lb (80.7 kg)  07/14/18 171 lb 9.6 oz (77.8 kg)     Physical Exam Vitals signs reviewed.  Constitutional:      Appearance: She is obese.  Eyes:     Extraocular Movements: Extraocular movements intact.     Conjunctiva/sclera: Conjunctivae normal.     Pupils: Pupils are equal, round, and reactive to light.  Neck:     Musculoskeletal: Normal range of motion and neck supple.  Cardiovascular:     Rate and Rhythm: Normal rate and regular rhythm.     Pulses: Normal pulses.     Heart sounds: Normal heart sounds.  Pulmonary:     Effort: Pulmonary effort is normal.     Breath sounds: Normal breath sounds.  Abdominal:     General: Bowel sounds are normal.     Palpations: Abdomen is soft. There is no mass.     Tenderness: There is no abdominal tenderness. There is no guarding.  Musculoskeletal:     Right lower leg: No edema.     Left lower leg: No edema.  Lymphadenopathy:     Cervical: No cervical adenopathy.  Skin:    General: Skin is warm and dry.  Neurological:     Mental Status: She is alert.     Lab Results  Component Value Date   WBC 16.5 (H) 06/09/2016   HGB 10.2 (L) 06/09/2016   HCT 29.7 (L) 06/09/2016   PLT 188 06/09/2016  No results found.  Assessment & Plan:   Latorria was seen today for establish care.  Diagnoses and all orders for this visit:  Abnormal liver enzymes -     Comprehensive metabolic panel; Future -     TSH; Future  Hyperlipidemia, unspecified hyperlipidemia type -     TSH; Future -     Lipid panel; Future  Hyperglycemia -     Hemoglobin A1c; Future   I am having Rhonda Pierce maintain her SPRINTEC 28, phentermine, ibuprofen, and fluticasone.  No orders of the defined types were placed in this encounter.   Problem List Items Addressed This Visit    None    Visit Diagnoses    Abnormal liver enzymes    -  Primary   Relevant Orders   Comprehensive metabolic panel   TSH   Hyperlipidemia, unspecified hyperlipidemia type       Relevant Orders   TSH   Lipid panel   Hyperglycemia        Relevant Orders   Hemoglobin A1c       Follow-up: Return if symptoms worsen or fail to improve.  Rhonda Lacy, NP

## 2018-10-25 NOTE — Patient Instructions (Signed)
Return to lab fasting at least 6-8 hrs prior. Ok to drink water.

## 2018-10-28 ENCOUNTER — Encounter: Payer: Self-pay | Admitting: Nurse Practitioner

## 2018-10-30 ENCOUNTER — Ambulatory Visit: Payer: 59 | Admitting: Nurse Practitioner

## 2018-10-31 ENCOUNTER — Encounter: Payer: Self-pay | Admitting: Nurse Practitioner

## 2018-10-31 NOTE — Progress Notes (Unsigned)
Abstracted result and sent to scan  

## 2018-11-05 ENCOUNTER — Other Ambulatory Visit (INDEPENDENT_AMBULATORY_CARE_PROVIDER_SITE_OTHER): Payer: No Typology Code available for payment source

## 2018-11-05 ENCOUNTER — Other Ambulatory Visit: Payer: No Typology Code available for payment source

## 2018-11-05 DIAGNOSIS — R739 Hyperglycemia, unspecified: Secondary | ICD-10-CM

## 2018-11-05 DIAGNOSIS — E785 Hyperlipidemia, unspecified: Secondary | ICD-10-CM | POA: Diagnosis not present

## 2018-11-05 DIAGNOSIS — R748 Abnormal levels of other serum enzymes: Secondary | ICD-10-CM

## 2018-11-05 LAB — LIPID PANEL
Cholesterol: 185 mg/dL (ref 0–200)
HDL: 45.7 mg/dL (ref >39.00)
LDL Cholesterol: 121 mg/dL — ABNORMAL HIGH (ref 0–99)
NONHDL: 139.38
TRIGLYCERIDES: 90 mg/dL (ref 0.0–149.0)
Total CHOL/HDL Ratio: 4
VLDL: 18 mg/dL (ref 0.0–40.0)

## 2018-11-05 LAB — COMPREHENSIVE METABOLIC PANEL
ALT: 19 U/L (ref 0–35)
AST: 18 U/L (ref 0–37)
Albumin: 4.4 g/dL (ref 3.5–5.2)
Alkaline Phosphatase: 53 U/L (ref 39–117)
BUN: 10 mg/dL (ref 6–23)
CO2: 25 meq/L (ref 19–32)
Calcium: 9.3 mg/dL (ref 8.4–10.5)
Chloride: 106 mEq/L (ref 96–112)
Creatinine, Ser: 0.82 mg/dL (ref 0.40–1.20)
GFR: 83.14 mL/min (ref >60.00)
Glucose, Bld: 84 mg/dL (ref 70–99)
Potassium: 3.5 mEq/L (ref 3.5–5.1)
SODIUM: 141 meq/L (ref 135–145)
Total Bilirubin: 0.6 mg/dL (ref 0.2–1.2)
Total Protein: 7.7 g/dL (ref 6.0–8.3)

## 2018-11-05 LAB — TSH: TSH: 0.92 u[IU]/mL (ref 0.35–4.50)

## 2018-11-06 LAB — HEMOGLOBIN A1C
HEMOGLOBIN A1C: 5.3 %{Hb} (ref <5.7)
Mean Plasma Glucose: 105 (calc)
eAG (mmol/L): 5.8 (calc)

## 2018-12-12 ENCOUNTER — Ambulatory Visit (INDEPENDENT_AMBULATORY_CARE_PROVIDER_SITE_OTHER): Payer: Self-pay | Admitting: Nurse Practitioner

## 2018-12-12 ENCOUNTER — Other Ambulatory Visit: Payer: Self-pay

## 2018-12-12 VITALS — BP 95/65 | HR 81 | Temp 98.2°F | Resp 18 | Wt 171.0 lb

## 2018-12-12 DIAGNOSIS — B9789 Other viral agents as the cause of diseases classified elsewhere: Secondary | ICD-10-CM

## 2018-12-12 DIAGNOSIS — J019 Acute sinusitis, unspecified: Secondary | ICD-10-CM

## 2018-12-12 MED ORDER — MONTELUKAST SODIUM 10 MG PO TABS: 10.0000 mg | ORAL_TABLET | Freq: Every day | ORAL | 0 refills | Status: DC

## 2018-12-12 MED ORDER — CETIRIZINE HCL 10 MG PO TABS: 10.0000 mg | ORAL_TABLET | Freq: Every day | ORAL | 0 refills | Status: DC

## 2018-12-12 MED ORDER — FLUTICASONE PROPIONATE 50 MCG/ACT NA SUSP: 2.0000 | Freq: Every day | NASAL | 0 refills | Status: DC

## 2018-12-12 MED ORDER — PSEUDOEPH-BROMPHEN-DM 30-2-10 MG/5ML PO SYRP: 5.0000 mL | ORAL_SOLUTION | Freq: Four times a day (QID) | ORAL | 0 refills | Status: AC | PRN

## 2018-12-12 MED FILL — BROMPHENIR-PSEUDOEPHED-DM S: 30-2-10 | 7 days supply | Qty: 150 | Fill #0

## 2018-12-12 MED FILL — MONTELUKAST SOD 10 MG TAB: 10 | 30 days supply | Qty: 30 | Fill #0

## 2018-12-12 MED FILL — FLUTICASONE PROP 50 MCG SPR: 50 | 30 days supply | Qty: 16 | Fill #0

## 2018-12-12 NOTE — Progress Notes (Signed)
MRN: [id number] DOB: 02/19/1991  Subjective:   Rhonda Pierce is a 28 y.o. female presenting for chief complaint of Sinus Problem (3 days); Cough (3 days); Sore Throat (3 days); Ear Fullness (3 days, right ear ); and Headache (3 days ) .  Reports 3 day history of sinus headache, sinus congestion , ear fullness, sore throat and productive cough, fatigue. Has tried nothing for relief. Denies fever, itchy watery eyes, red eyes, ear drainage, difficulty swallowing, pain with swallowing, inability to swallow, voice change, wheezing, shortness of breath, chest tightness and chest pain, chills, malaise, nausea, vomiting, abdominal pain and diarrhea. Has not had any sick contacts. Admits to a history of seasonal allergies, denies history of asthma. Patient has had a flu shot this season. Denies smoking. Denies any other aggravating or relieving factors, no other questions or concerns.  The patient was last treated for sinusitis in December, 2019.  Review of Systems  Constitutional: Positive for malaise/fatigue. Negative for chills and fever.  HENT: Positive for congestion, sinus pain and sore throat. Negative for ear pain.        Bilateral ear pressure/fullness, postnasal drip  Eyes: Negative.   Respiratory: Positive for cough and sputum production. Negative for shortness of breath and wheezing.   Cardiovascular: Negative.   Gastrointestinal: Negative.   Skin: Negative.   Neurological: Positive for headaches. Negative for dizziness, tingling, sensory change, focal weakness, seizures and weakness.  Endo/Heme/Allergies: Positive for environmental allergies.    Rhonda Pierce has a current medication list which includes the following prescription(s): ibuprofen, fluticasone, phentermine, and sprintec 28. Also has No Known Allergies.  Rhonda Pierce  has a past medical history of Medical history non-contributory. Also  has a past surgical history that includes No past surgeries and Therapeutic abortion.   Objective:    Vitals: BP 95/65 (BP Location: Right Arm, Patient Position: Sitting, Cuff Size: Normal)   Pulse 81   Temp 98.2 F (36.8 C) (Oral)   Resp 18   Wt 171 lb (77.6 kg)   SpO2 98%   BMI 30.29 kg/m   Physical Exam Vitals signs reviewed.  Constitutional:      General: She is not in acute distress. HENT:     Head: Normocephalic.     Right Ear: Ear canal and external ear normal. A middle ear effusion is present. Tympanic membrane is not erythematous.     Left Ear: Tympanic membrane, ear canal and external ear normal.  No middle ear effusion. Tympanic membrane is not erythematous.     Nose: Mucosal edema, congestion (moderate) and rhinorrhea (clear nasal drainage) present.     Right Turbinates: Enlarged and swollen.     Left Turbinates: Enlarged and swollen.     Right Sinus: Maxillary sinus tenderness and frontal sinus tenderness present.     Left Sinus: Maxillary sinus tenderness and frontal sinus tenderness present.     Mouth/Throat:     Lips: Pink.     Mouth: Mucous membranes are moist.     Pharynx: Uvula midline. Posterior oropharyngeal erythema present. No pharyngeal swelling, oropharyngeal exudate or uvula swelling.     Tonsils: No tonsillar exudate. Swelling: 0 on the right. 0 on the left.  Neck:     Musculoskeletal: Normal range of motion and neck supple.  Cardiovascular:     Rate and Rhythm: Normal rate and regular rhythm.     Pulses: Normal pulses.     Heart sounds: Normal heart sounds.  Pulmonary:     Effort: Pulmonary effort is normal. No respiratory  distress.     Breath sounds: Normal breath sounds. No stridor. No wheezing, rhonchi or rales.  Abdominal:     General: Bowel sounds are normal.     Palpations: Abdomen is soft.     Tenderness: There is no abdominal tenderness.  Lymphadenopathy:     Cervical: No cervical adenopathy.  Skin:    General: Skin is warm and dry.     Findings: No rash.  Neurological:     General: No focal deficit present.     Mental Status:  She is alert and oriented to person, place, and time.     Cranial Nerves: No cranial nerve deficit.  Psychiatric:        Mood and Affect: Mood normal.        Behavior: Behavior normal.     Assessment and Plan :   Exam findings, diagnosis etiology and medication use and indications reviewed with patient. Follow- Up and discharge instructions provided. No emergent/urgent issues found on exam.  Based on the patient's clinical presentation, symptoms, duration of symptoms physical assessment, patient's findings are congruent with that of viral etiology.  Patient has not displayed fever, purulent nasal drainage, or double sickening. I had a long discussion with the patient as to why antibiotics are not recommended at this time and recurrence of her sinus symptoms.  Informed patient that if she continues to have sinus problems and her symptoms are reoccurring, she may have to consider ENT in the future.  I have informed the patient that I am going to begin symptomatic treatment with her.  I am starting the patient on Zyrtec, Flonase, and Bromfed.   Instructed the patient to follow-up with our office if she develops a fever greater than 102 or a sudden worsening of symptoms or if her symptoms do not improve within 10-14 days. At that time informed patient that an antibiotic may be appropriate.  The patient is well-appearing, is in no acute distress, and vital signs are stable at this time.  Patient education was provided. Patient verbalized understanding of information provided and agrees with plan of care (POC), all questions answered. The patient is advised to call or return to clinic if condition does not see an improvement in symptoms, or to seek the care of the closest emergency department if condition worsens with the above plan.   1. Acute viral sinusitis  - montelukast (SINGULAIR) 10 MG tablet; Take 1 tablet (10 mg total) by mouth at bedtime for 30 days.  Dispense: 30 tablet; Refill: 0 -  brompheniramine-pseudoephedrine-DM 30-2-10 MG/5ML syrup; Take 5 mLs by mouth 4 (four) times daily as needed for up to 7 days.  Dispense: 150 mL; Refill: 0 - fluticasone (FLONASE) 50 MCG/ACT nasal spray; Place 2 sprays into both nostrils daily for 10 days.  Dispense: 16 g; Refill: 0 - cetirizine (ZYRTEC) 10 MG tablet; Take 1 tablet (10 mg total) by mouth daily for 30 days.  Dispense: 30 tablet; Refill: 0 -Take medication as prescribed. -Ibuprofen or Tylenol for pain, fever, or general discomfort. -Increase fluids. -Sleep elevated on at least 2 pillows at bedtime to help with cough. -Use a humidifier or vaporizer when at home and during sleep. -May use a teaspoon of honey or over-the-counter cough drops to help with cough. -May use normal saline nasal spray to help with nasal congestion throughout the day. -Follow-up in 10-14 days if symptoms do not improve.

## 2018-12-12 NOTE — Patient Instructions (Signed)
Sinusitis, Adult  -Take medication as prescribed. -Ibuprofen or Tylenol for pain, fever, or general discomfort. -Increase fluids. -Sleep elevated on at least 2 pillows at bedtime to help with cough. -Use a humidifier or vaporizer when at home and during sleep. -May use a teaspoon of honey or over-the-counter cough drops to help with cough. -May use normal saline nasal spray to help with nasal congestion throughout the day. -Follow-up in 10-14 days if symptoms do not improve.     Sinusitis is inflammation of your sinuses. Sinuses are hollow spaces in the bones around your face. Your sinuses are located:  Around your eyes.  In the middle of your forehead.  Behind your nose.  In your cheekbones. Mucus normally drains out of your sinuses. When your nasal tissues become inflamed or swollen, mucus can become trapped or blocked. This allows bacteria, viruses, and fungi to grow, which leads to infection. Most infections of the sinuses are caused by a virus. Sinusitis can develop quickly. It can last for up to 4 weeks (acute) or for more than 12 weeks (chronic). Sinusitis often develops after a cold. What are the causes? This condition is caused by anything that creates swelling in the sinuses or stops mucus from draining. This includes:  Allergies.  Asthma.  Infection from bacteria or viruses.  Deformities or blockages in your nose or sinuses.  Abnormal growths in the nose (nasal polyps).  Pollutants, such as chemicals or irritants in the air.  Infection from fungi (rare). What increases the risk? You are more likely to develop this condition if you:  Have a weak body defense system (immune system).  Do a lot of swimming or diving.  Overuse nasal sprays.  Smoke. What are the signs or symptoms? The main symptoms of this condition are pain and a feeling of pressure around the affected sinuses. Other symptoms include:  Stuffy nose or congestion.  Thick drainage from your  nose.  Swelling and warmth over the affected sinuses.  Headache.  Upper toothache.  A cough that may get worse at night.  Extra mucus that collects in the throat or the back of the nose (postnasal drip).  Decreased sense of smell and taste.  Fatigue.  A fever.  Sore throat.  Bad breath. How is this diagnosed? This condition is diagnosed based on:  Your symptoms.  Your medical history.  A physical exam.  Tests to find out if your condition is acute or chronic. This may include: ? Checking your nose for nasal polyps. ? Viewing your sinuses using a device that has a light (endoscope). ? Testing for allergies or bacteria. ? Imaging tests, such as an MRI or CT scan. In rare cases, a bone biopsy may be done to rule out more serious types of fungal sinus disease. How is this treated? Treatment for sinusitis depends on the cause and whether your condition is chronic or acute.  If caused by a virus, your symptoms should go away on their own within 10 days. You may be given medicines to relieve symptoms. They include: ? Medicines that shrink swollen nasal passages (topical intranasal decongestants). ? Medicines that treat allergies (antihistamines). ? A spray that eases inflammation of the nostrils (topical intranasal corticosteroids). ? Rinses that help get rid of thick mucus in your nose (nasal saline washes).  If caused by bacteria, your health care provider may recommend waiting to see if your symptoms improve. Most bacterial infections will get better without antibiotic medicine. You may be given antibiotics if you  have: ? A severe infection. ? A weak immune system.  If caused by narrow nasal passages or nasal polyps, you may need to have surgery. Follow these instructions at home: Medicines  Take, use, or apply over-the-counter and prescription medicines only as told by your health care provider. These may include nasal sprays.  If you were prescribed an antibiotic  medicine, take it as told by your health care provider. Do not stop taking the antibiotic even if you start to feel better. Hydrate and humidify   Drink enough fluid to keep your urine pale yellow. Staying hydrated will help to thin your mucus.  Use a cool mist humidifier to keep the humidity level in your home above 50%.  Inhale steam for 10-15 minutes, 3-4 times a day, or as told by your health care provider. You can do this in the bathroom while a hot shower is running.  Limit your exposure to cool or dry air. Rest  Rest as much as possible.  Sleep with your head raised (elevated).  Make sure you get enough sleep each night. General instructions   Apply a warm, moist washcloth to your face 3-4 times a day or as told by your health care provider. This will help with discomfort.  Wash your hands often with soap and water to reduce your exposure to germs. If soap and water are not available, use hand sanitizer.  Do not smoke. Avoid being around people who are smoking (secondhand smoke).  Keep all follow-up visits as told by your health care provider. This is important. Contact a health care provider if:  You have a fever.  Your symptoms get worse.  Your symptoms do not improve within 10 days. Get help right away if:  You have a severe headache.  You have persistent vomiting.  You have severe pain or swelling around your face or eyes.  You have vision problems.  You develop confusion.  Your neck is stiff.  You have trouble breathing. Summary  Sinusitis is soreness and inflammation of your sinuses. Sinuses are hollow spaces in the bones around your face.  This condition is caused by nasal tissues that become inflamed or swollen. The swelling traps or blocks the flow of mucus. This allows bacteria, viruses, and fungi to grow, which leads to infection.  If you were prescribed an antibiotic medicine, take it as told by your health care provider. Do not stop taking  the antibiotic even if you start to feel better.  Keep all follow-up visits as told by your health care provider. This is important. This information is not intended to replace advice given to you by your health care provider. Make sure you discuss any questions you have with your health care provider. Document Released: 09/19/2005 Document Revised: 02/19/2018 Document Reviewed: 02/19/2018 Elsevier Interactive Patient Education  2019 [employer].

## 2018-12-14 ENCOUNTER — Telehealth: Payer: Self-pay

## 2018-12-14 NOTE — Telephone Encounter (Signed)
Called and lm on pt vm tcb regarding how she is feeling since her visit with [place].

## 2019-02-20 ENCOUNTER — Ambulatory Visit: Payer: Self-pay | Admitting: *Deleted

## 2019-02-20 ENCOUNTER — Telehealth: Payer: Self-pay | Admitting: Nurse Practitioner

## 2019-02-20 NOTE — Telephone Encounter (Signed)
With use of mask during encounter with patient, that gives protection. With regard to her son and mother, she can use universal precautions at home: wearing mask, not sharing utensils, sanitizing surfaces frequently, and self monitoring for any symptoms.

## 2019-02-20 NOTE — Telephone Encounter (Signed)
LVM for the pt to call back. [place] for triage nurse to give detail message.

## 2019-02-20 NOTE — Telephone Encounter (Signed)
I agree with recommendation given to her. Does she have a specific question? Did she have any PPE  (e.g. surgical mask) on when taking care of this patient?

## 2019-02-20 NOTE — Telephone Encounter (Signed)
Charlotte please advise 

## 2019-02-20 NOTE — Telephone Encounter (Signed)
Pt called because she was exposed to a pt at work on Monday, who tested positive for covid-19. She is not having any symptoms. She is worried about taking care of her 28 year old son and mother who is having treatments for breast cancer.   Health at work advised her that she can go back to work. Advised that if she starts having any symptoms to contact her provider.  She is not experiencing any symptoms right now. She needs to continue practice social distancing, mask protection and sanitizing. Pt voiced understanding and would like to have her pcp to give her a call back for any other recommendation. Routing to flow at [facility] at [place] over [facility] for Review and recommendation.  Reason for Disposition . [1] COVID-19 EXPOSURE (Close Contact) within last 14 days AND [2] NO cough, fever, or breathing difficulty  Answer Assessment - Initial Assessment Questions 1. CLOSE CONTACT: "Who is the person with the confirmed or suspected COVID-19 infection that you were exposed to?"     Handled the person's drivers's license  2. PLACE of CONTACT: "Where were you when you were exposed to COVID-19?" (e.g., home, school, medical waiting room; which city?)     At work at the [employer] [facility] [employer] 3. TYPE of CONTACT: "How much contact was there?" (e.g., sitting next to, live in same house, work in same office, same building)     Just touching the card 4. DURATION of CONTACT: "How long were you in contact with the COVID-19 patient?" (e.g., a few seconds, passed by person, a few minutes, live with the patient)     seconds 5. DATE of CONTACT: "When did you have contact with a COVID-19 patient?" (e.g., how many days ago)     On Monday 6. TRAVEL: "Have you traveled out of the country recently?" If so, "When and where?"     * Also ask about out-of-state travel, since the CDC has identified some high risk cities for community spread in the [place].     * Note: Travel becomes less relevant if there is widespread community  transmission where the patient lives.     no 7. COMMUNITY SPREAD: "Are there lots of cases of COVID-19 (community spread) where you live?" (See public health department website, if unsure)   * MAJOR community spread: high number of cases; numbers of cases are increasing; many people hospitalized.   * MINOR community spread: low number of cases; not increasing; few or no people hospitalized     In the community 8. SYMPTOMS: "Do you have any symptoms?" (e.g., fever, cough, breathing difficulty)     none 9. PREGNANCY OR POSTPARTUM: "Is there any chance you are pregnant?" "When was your last menstrual period?" "Did you deliver in the last 2 weeks?"    10. HIGH RISK: "Do you have any heart or lung problems? Do you have a weak immune system?" (e.g., CHF, COPD, asthma, HIV positive, chemotherapy, renal failure, diabetes mellitus, sickle cell anemia)       no  Protocols used: CORONAVIRUS (COVID-19) EXPOSURE-A-AH

## 2019-02-20 NOTE — Telephone Encounter (Signed)
Patient requesting advice from PCP. Read advice, as written to patient. She denies any symptoms at this time. Stated she understood regarding universal precautions at home around her family including wearing a mask/no sharing utensils/sanitizing frequently and monitoring symptoms. Call back at first sign of symptoms.

## 2019-02-20 NOTE — Telephone Encounter (Signed)
Spoke with the pt, advise her Charlotte response. Pt is aware but she was wondering if it is okey for her to be around her son and mother even if she doesn't have any symptoms but she came in contact with pt dx with COVID 19 (she does not have any one else to take care of them that's why she is worry). 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 She is working at [employer] and they are wearing mask and hand sanitizer near by. Please advise.

## 2019-02-22 NOTE — Telephone Encounter (Signed)
Left another vm for the pt to call back.  

## 2019-04-18 ENCOUNTER — Other Ambulatory Visit: Payer: Self-pay

## 2019-04-19 ENCOUNTER — Ambulatory Visit (INDEPENDENT_AMBULATORY_CARE_PROVIDER_SITE_OTHER): Payer: No Typology Code available for payment source | Admitting: Nurse Practitioner

## 2019-04-19 ENCOUNTER — Encounter: Payer: Self-pay | Admitting: Nurse Practitioner

## 2019-04-19 VITALS — BP 130/82 | HR 86 | Temp 98.4°F | Ht 63.0 in | Wt 190.4 lb

## 2019-04-19 DIAGNOSIS — N926 Irregular menstruation, unspecified: Secondary | ICD-10-CM

## 2019-04-19 DIAGNOSIS — E785 Hyperlipidemia, unspecified: Secondary | ICD-10-CM | POA: Diagnosis not present

## 2019-04-19 DIAGNOSIS — Z0001 Encounter for general adult medical examination with abnormal findings: Secondary | ICD-10-CM | POA: Diagnosis not present

## 2019-04-19 LAB — LIPID PANEL
Cholesterol: 218 mg/dL — ABNORMAL HIGH (ref 0–200)
HDL: 61.4 mg/dL (ref >39.00)
LDL Cholesterol: 121 mg/dL — ABNORMAL HIGH (ref 0–99)
NonHDL: 156.28
Total CHOL/HDL Ratio: 4
Triglycerides: 178 mg/dL — ABNORMAL HIGH (ref 0.0–149.0)
VLDL: 35.6 mg/dL (ref 0.0–40.0)

## 2019-04-19 LAB — CBC
HCT: 41 % (ref 36.0–46.0)
Hemoglobin: 13.2 g/dL (ref 12.0–15.0)
MCHC: 32.2 g/dL (ref 30.0–36.0)
MCV: 72.8 fl — ABNORMAL LOW (ref 78.0–100.0)
Platelets: 295 10*3/uL (ref 150.0–400.0)
RBC: 5.63 Mil/uL — ABNORMAL HIGH (ref 3.87–5.11)
RDW: 14.9 % (ref 11.5–15.5)
WBC: 9.8 10*3/uL (ref 4.0–10.5)

## 2019-04-19 LAB — HCG, QUANTITATIVE, PREGNANCY: Quantitative HCG: <0.6 m[IU]/mL

## 2019-04-19 LAB — HEPATIC FUNCTION PANEL
ALT: 14 U/L (ref 0–35)
AST: 16 U/L (ref 0–37)
Albumin: 4.3 g/dL (ref 3.5–5.2)
Alkaline Phosphatase: 74 U/L (ref 39–117)
Bilirubin, Direct: 0.1 mg/dL (ref 0.0–0.3)
Total Bilirubin: 0.4 mg/dL (ref 0.2–1.2)
Total Protein: 7.3 g/dL (ref 6.0–8.3)

## 2019-04-19 NOTE — Progress Notes (Signed)
Subjective:    Patient ID: Rhonda Pierce, female    DOB: 1990-12-15, 28 y.o.   MRN: [id number]  Patient presents today for complete physical   HPI  Sexual History (orientation,birth control, marital status, STD): PAP is uptodate by Dr. Cletis Media ([facility]). Will like serum pregnancy test. Home preganacy test negative last week.  Depression/Suicide: Depression screen [facility] 2/9 10/25/2018 07/14/2018 06/18/2018 06/16/2018 01/22/2016 01/31/2015  Decreased Interest 0 0 0 0 0 0  Down, Depressed, Hopeless 0 0 0 0 0 0  PHQ - 2 Score 0 0 0 0 0 0   Vision:delayed due to COVID pandemic  Dental:delayed due to COVID pandemic  Immunizations: (TDAP, Hep C screen, Pneumovax, Influenza, zoster)  Health Maintenance  Topic Date Due  . PAP-Cervical Cytology Screening  11/10/2018  . Pap Smear  11/10/2018  . Flu Shot  05/04/2019  . Tetanus Vaccine  06/09/2026  . HIV Screening  Completed   Diet:regular  Weight:  Wt Readings from Last 3 Encounters:  04/19/19 190 lb 6.4 oz (86.4 kg)  12/12/18 171 lb (77.6 kg)  10/25/18 173 lb (78.5 kg)   Exercise:none  Fall Risk: Fall Risk  10/25/2018 06/18/2018 06/16/2018 01/22/2016 01/31/2015  Falls in the past year? 0 No No No No   Medications and allergies reviewed with patient and updated if appropriate.  Patient Active Problem List   Diagnosis Date Noted  . Vaginal delivery 06/08/2016  . Fall 04/08/2016  . BV (bacterial vaginosis) 04/08/2016  . 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 Anemia affecting pregnancy 03/10/2016    Current Outpatient Medications on File Prior to Visit  Medication Sig Dispense Refill  . cetirizine (ZYRTEC) 10 MG tablet Take 1 tablet (10 mg total) by mouth daily for 30 days. 30 tablet 0  . ibuprofen (ADVIL,MOTRIN) 200 MG tablet Take 200 mg by mouth every 6 (six) hours as needed for moderate pain.    . phentermine (ADIPEX-P) 37.5 MG tablet phentermine 37.5 mg tablet  TAKE 1 TABLET BY MOUTH EVERY  MORNING     No current facility-administered medications on file prior to visit.     Past Medical History:  Diagnosis Date  . Medical history non-contributory     Past Surgical History:  Procedure Laterality Date  . NO PAST SURGERIES    . THERAPEUTIC ABORTION      Social History   Socioeconomic History  . Marital status: Single    Spouse name: Not on file  . Number of children: 1  . Years of education: Not on file  . Highest education level: Not on file  Occupational History  . Not on file  Social Needs  . Financial resource strain: Not on file  . Food insecurity    Worry: Not on file    Inability: Not on file  . Transportation needs    Medical: Not on file    Non-medical: Not on file  Tobacco Use  . Smoking status: Former Smoker    Types: Cigarettes  . Smokeless tobacco: Never Used  . Tobacco comment: occ smoker; quit with preg  Substance and Sexual Activity  . Alcohol use: No    Alcohol/week: 0.0 standard drinks  . Drug use: No  . Sexual activity: Yes    Birth control/protection: Condom  Lifestyle  . Physical activity    Days per week: Not on file    Minutes per session: Not on file  . Stress: Not on file  Relationships  .  Social Herbalist on phone: Not on file    Gets together: Not on file    Attends religious service: Not on file    Active member of club or organization: Not on file    Attends meetings of clubs or organizations: Not on file    Relationship status: Not on file  Other Topics Concern  . Not on file  Social History Narrative  . Not on file    Family History  Problem Relation Age of Onset  . Cancer Mother        lung  . Cancer Maternal Grandfather        lung        Review of Systems  Constitutional: Negative for fever, malaise/fatigue and weight loss.  HENT: Negative for congestion and sore throat.   Eyes:       Negative for visual changes  Respiratory: Negative for cough and shortness of breath.    Cardiovascular: Negative for chest pain, palpitations and leg swelling.  Gastrointestinal: Negative for blood in stool, constipation, diarrhea and heartburn.  Genitourinary: Negative for dysuria, frequency and urgency.  Musculoskeletal: Negative for falls, joint pain and myalgias.  Skin: Negative for rash.  Neurological: Negative for dizziness, sensory change and headaches.  Endo/Heme/Allergies: Does not bruise/bleed easily.  Psychiatric/Behavioral: Negative for depression, substance abuse and suicidal ideas. The patient is not nervous/anxious.     Objective:   Vitals:   04/19/19 0815  BP: 130/82  Pulse: 86  Temp: 98.4 F (36.9 C)  SpO2: 99%    Body mass index is 33.73 kg/m.   Physical Examination:  Physical Exam Vitals signs reviewed. Exam conducted with a chaperone present.  HENT:     Head: Normocephalic.     Right Ear: Tympanic membrane, ear canal and external ear normal.     Left Ear: Tympanic membrane, ear canal and external ear normal.     Nose: Nose normal.     Mouth/Throat:     Mouth: Mucous membranes are moist.     Pharynx: No posterior oropharyngeal erythema.  Eyes:     Extraocular Movements: Extraocular movements intact.     Conjunctiva/sclera: Conjunctivae normal.  Neck:     Musculoskeletal: Normal range of motion and neck supple.  Cardiovascular:     Rate and Rhythm: Normal rate and regular rhythm.     Pulses: Normal pulses.     Heart sounds: Normal heart sounds.  Pulmonary:     Effort: Pulmonary effort is normal.     Breath sounds: Normal breath sounds.  Chest:     Breasts:        Right: Normal.        Left: Normal.  Abdominal:     General: Bowel sounds are normal.     Palpations: Abdomen is soft.  Genitourinary:    Comments: Deferred to GYN per patient Musculoskeletal: Normal range of motion.     Right lower leg: No edema.     Left lower leg: No edema.  Lymphadenopathy:     Upper Body:     Right upper body: No supraclavicular, axillary or  pectoral adenopathy.     Left upper body: No supraclavicular, axillary or pectoral adenopathy.  Neurological:     Mental Status: She is alert and oriented to person, place, and time.  Psychiatric:        Mood and Affect: Mood normal.        Behavior: Behavior normal.  Thought Content: Thought content normal.    ASSESSMENT and PLAN:  Rhonda Pierce was seen today for annual exam.  Diagnoses and all orders for this visit:  Encounter for preventative adult health care exam with abnormal findings -     CBC  Hyperlipidemia, unspecified hyperlipidemia type -     Lipid panel -     Hepatic function panel  Irregular menstrual cycle -     Cancel: POCT urine pregnancy -     B-HCG Quant    No problem-specific Assessment & Plan notes found for this encounter.      Problem List Items Addressed This Visit    None    Visit Diagnoses    Encounter for preventative adult health care exam with abnormal findings    -  Primary   Relevant Orders   CBC   Hyperlipidemia, unspecified hyperlipidemia type       Relevant Orders   Lipid panel   Hepatic function panel   Irregular menstrual cycle       Relevant Orders   B-HCG Quant       Follow up: Return if symptoms worsen or fail to improve.  Wilfred Lacy, NP

## 2019-04-19 NOTE — Patient Instructions (Addendum)
We will obtain last PAP results from GYN: Dr. Cletis Media ([place]).  Go to lab for blood draw  You may use videos for indoor exercise.Marland Kitchen  Health Maintenance, Female Adopting a healthy lifestyle and getting preventive care are important in promoting health and wellness. Ask your health care provider about:  The right schedule for you to have regular tests and exams.  Things you can do on your own to prevent diseases and keep yourself healthy. What should I know about diet, weight, and exercise? Eat a healthy diet   Eat a diet that includes plenty of vegetables, fruits, low-fat dairy products, and lean protein.  Do not eat a lot of foods that are high in solid fats, added sugars, or sodium. Maintain a healthy weight Body mass index (BMI) is used to identify weight problems. It estimates body fat based on height and weight. Your health care provider can help determine your BMI and help you achieve or maintain a healthy weight. Get regular exercise Get regular exercise. This is one of the most important things you can do for your health. Most adults should:  Exercise for at least 150 minutes each week. The exercise should increase your heart rate and make you sweat (moderate-intensity exercise).  Do strengthening exercises at least twice a week. This is in addition to the moderate-intensity exercise.  Spend less time sitting. Even light physical activity can be beneficial. Watch cholesterol and blood lipids Have your blood tested for lipids and cholesterol at 28 years of age, then have this test every 5 years. Have your cholesterol levels checked more often if:  Your lipid or cholesterol levels are high.  You are older than 28 years of age.  You are at high risk for heart disease. What should I know about cancer screening? Depending on your health history and family history, you may need to have cancer screening at various ages. This may include screening for:  Breast  cancer.  Cervical cancer.  Colorectal cancer.  Skin cancer.  Lung cancer. What should I know about heart disease, diabetes, and high blood pressure? Blood pressure and heart disease  High blood pressure causes heart disease and increases the risk of stroke. This is more likely to develop in people who have high blood pressure readings, are of African descent, or are overweight.  Have your blood pressure checked: ? Every 3-5 years if you are 28-13 years of age. ? Every year if you are 40 years old or older. Diabetes Have regular diabetes screenings. This checks your fasting blood sugar level. Have the screening done:  Once every three years after age 22 if you are at a normal weight and have a low risk for diabetes.  More often and at a younger age if you are overweight or have a high risk for diabetes. What should I know about preventing infection? Hepatitis B If you have a higher risk for hepatitis B, you should be screened for this virus. Talk with your health care provider to find out if you are at risk for hepatitis B infection. Hepatitis C Testing is recommended for:  Everyone born from [birth year] through 1965.  Anyone with known risk factors for hepatitis C. Sexually transmitted infections (STIs)  Get screened for STIs, including gonorrhea and chlamydia, if: ? You are sexually active and are younger than 28 years of age. ? You are older than 28 years of age and your health care provider tells you that you are at risk for this type  of infection. ? Your sexual activity has changed since you were last screened, and you are at increased risk for chlamydia or gonorrhea. Ask your health care provider if you are at risk.  Ask your health care provider about whether you are at high risk for HIV. Your health care provider may recommend a prescription medicine to help prevent HIV infection. If you choose to take medicine to prevent HIV, you should first get tested for HIV. You should  then be tested every 3 months for as long as you are taking the medicine. Pregnancy  If you are about to stop having your period (premenopausal) and you may become pregnant, seek counseling before you get pregnant.  Take 400 to 800 micrograms (mcg) of folic acid every day if you become pregnant.  Ask for birth control (contraception) if you want to prevent pregnancy. Osteoporosis and menopause Osteoporosis is a disease in which the bones lose minerals and strength with aging. This can result in bone fractures. If you are 80 years old or older, or if you are at risk for osteoporosis and fractures, ask your health care provider if you should:  Be screened for bone loss.  Take a calcium or vitamin D supplement to lower your risk of fractures.  Be given hormone replacement therapy (HRT) to treat symptoms of menopause. Follow these instructions at home: Lifestyle  Do not use any products that contain nicotine or tobacco, such as cigarettes, e-cigarettes, and chewing tobacco. If you need help quitting, ask your health care provider.  Do not use street drugs.  Do not share needles.  Ask your health care provider for help if you need support or information about quitting drugs. Alcohol use  Do not drink alcohol if: ? Your health care provider tells you not to drink. ? You are pregnant, may be pregnant, or are planning to become pregnant.  If you drink alcohol: ? Limit how much you use to 0-1 drink a day. ? Limit intake if you are breastfeeding.  Be aware of how much alcohol is in your drink. In the [place] one drink equals one 12 oz bottle of beer (355 mL), one 5 oz glass of wine (148 mL), or one 1 oz glass of hard liquor (44 mL). General instructions  Schedule regular health, dental, and eye exams.  Stay current with your vaccines.  Tell your health care provider if: ? You often feel depressed. ? You have ever been abused or do not feel safe at home. Summary  Adopting a  healthy lifestyle and getting preventive care are important in promoting health and wellness.  Follow your health care provider's instructions about healthy diet, exercising, and getting tested or screened for diseases.  Follow your health care provider's instructions on monitoring your cholesterol and blood pressure. This information is not intended to replace advice given to you by your health care provider. Make sure you discuss any questions you have with your health care provider. Document Released: 04/04/2011 Document Revised: 09/12/2018 Document Reviewed: 09/12/2018 Elsevier Patient Education  2020 [employer].

## 2019-04-25 ENCOUNTER — Encounter: Payer: Self-pay | Admitting: Nurse Practitioner

## 2019-04-25 NOTE — Progress Notes (Signed)
Error

## 2019-05-22 ENCOUNTER — Encounter: Payer: Self-pay | Admitting: Nurse Practitioner

## 2019-11-07 MED FILL — ACETAMINOPHEN/COD #3 TABLET: 300-30 | 4 days supply | Qty: 16 | Fill #0

## 2020-03-31 ENCOUNTER — Encounter: Payer: Self-pay | Admitting: Nurse Practitioner

## 2020-03-31 ENCOUNTER — Other Ambulatory Visit: Payer: No Typology Code available for payment source

## 2020-03-31 ENCOUNTER — Other Ambulatory Visit: Payer: Self-pay

## 2020-03-31 ENCOUNTER — Telehealth (INDEPENDENT_AMBULATORY_CARE_PROVIDER_SITE_OTHER): Payer: No Typology Code available for payment source | Admitting: Nurse Practitioner

## 2020-03-31 VITALS — Wt 186.0 lb

## 2020-03-31 DIAGNOSIS — R110 Nausea: Secondary | ICD-10-CM | POA: Diagnosis not present

## 2020-03-31 DIAGNOSIS — R5383 Other fatigue: Secondary | ICD-10-CM

## 2020-03-31 LAB — HEPATIC FUNCTION PANEL
ALT: 14 U/L (ref 0–35)
AST: 15 U/L (ref 0–37)
Albumin: 4.4 g/dL (ref 3.5–5.2)
Alkaline Phosphatase: 66 U/L (ref 39–117)
Bilirubin, Direct: 0.1 mg/dL (ref 0.0–0.3)
Total Bilirubin: 0.5 mg/dL (ref 0.2–1.2)
Total Protein: 7.4 g/dL (ref 6.0–8.3)

## 2020-03-31 LAB — CBC WITH DIFFERENTIAL/PLATELET
Basophils Absolute: 0 10*3/uL (ref 0.0–0.1)
Basophils Relative: 0.4 % (ref 0.0–3.0)
Eosinophils Absolute: 0 10*3/uL (ref 0.0–0.7)
Eosinophils Relative: 0.3 % (ref 0.0–5.0)
HCT: 37.4 % (ref 36.0–46.0)
Hemoglobin: 12.5 g/dL (ref 12.0–15.0)
Lymphocytes Relative: 28.3 % (ref 12.0–46.0)
Lymphs Abs: 3.2 10*3/uL (ref 0.7–4.0)
MCHC: 33.4 g/dL (ref 30.0–36.0)
MCV: 72.5 fl — ABNORMAL LOW (ref 78.0–100.0)
Monocytes Absolute: 0.6 10*3/uL (ref 0.1–1.0)
Monocytes Relative: 5.4 % (ref 3.0–12.0)
Neutro Abs: 7.4 10*3/uL (ref 1.4–7.7)
Neutrophils Relative %: 65.6 % (ref 43.0–77.0)
Platelets: 269 10*3/uL (ref 150.0–400.0)
RBC: 5.15 Mil/uL — ABNORMAL HIGH (ref 3.87–5.11)
RDW: 14.9 % (ref 11.5–15.5)
WBC: 11.3 10*3/uL — ABNORMAL HIGH (ref 4.0–10.5)

## 2020-03-31 LAB — TSH: TSH: 1.55 u[IU]/mL (ref 0.35–4.50)

## 2020-03-31 LAB — BASIC METABOLIC PANEL
BUN: 13 mg/dL (ref 6–23)
CO2: 27 mEq/L (ref 19–32)
Calcium: 9.7 mg/dL (ref 8.4–10.5)
Chloride: 102 mEq/L (ref 96–112)
Creatinine, Ser: 0.82 mg/dL (ref 0.40–1.20)
GFR: 82.32 mL/min (ref >60.00)
Glucose, Bld: 100 mg/dL — ABNORMAL HIGH (ref 70–99)
Potassium: 3.7 mEq/L (ref 3.5–5.1)
Sodium: 137 mEq/L (ref 135–145)

## 2020-03-31 LAB — HCG, QUANTITATIVE, PREGNANCY: Quantitative HCG: <0.6 m[IU]/mL

## 2020-03-31 MED ORDER — ONDANSETRON HCL 4 MG PO TABS: 4.0000 mg | ORAL_TABLET | Freq: Three times a day (TID) | ORAL | 0 refills | Status: DC | PRN

## 2020-03-31 MED FILL — ONDANSETRON HCL 4 MG TABS: 4 | 6 days supply | Qty: 20 | Fill #0

## 2020-03-31 NOTE — Progress Notes (Signed)
Virtual Visit via Video Note  I connected with@ on 03/31/20 at 11:30 AM EDT by a video enabled telemedicine application and verified that I am speaking with the correct person using two identifiers.  Location: Patient:Home Provider: Office Participants: patient and provider  I discussed the limitations of evaluation and management by telemedicine and the availability of in person appointments. I also discussed with the patient that there may be a patient responsible charge related to this service. The patient expressed understanding and agreed to proceed.  CC:pt reports she feels nauseous and tired since Friday//chest tightness on Friday but now resolved.improved nasal drainage with zrytec helped   History of Present Illness: GI Problem The primary symptoms include fatigue and nausea. Primary symptoms do not include fever, weight loss, abdominal pain, vomiting, diarrhea, melena, hematemesis, jaundice, hematochezia, dysuria, myalgias, arthralgias or rash. The illness began 3 to 5 days ago. The onset was gradual. The problem has been gradually worsening.  The illness is also significant for anorexia. The illness does not include dysphagia, odynophagia, bloating, constipation, tenesmus, back pain or itching. Associated medical issues do not include GERD, alcohol abuse, PUD, irritable bowel syndrome or diverticulitis.    Observations/Objective: Physical Exam Constitutional:      General: She is not in acute distress.    Appearance: She is not toxic-appearing or diaphoretic.  Pulmonary:     Effort: Pulmonary effort is normal.  Neurological:     Mental Status: She is alert and oriented to person, place, and time.    Assessment and Plan: Sindia was seen today for nausea.  Diagnoses and all orders for this visit:  Nausea -     CBC w/Diff -     Basic metabolic panel -     TSH -     B-HCG Quant -     ondansetron (ZOFRAN) 4 MG tablet; Take 1 tablet (4 mg total) by mouth every 8 (eight)  hours as needed for nausea or vomiting. -     Hepatic function panel  Other fatigue -     CBC w/Diff -     Basic metabolic panel -     TSH -     B-HCG Quant -     Hepatic function panel   Follow Up Instructions: Go to lab for blood draw. Maintain adequate oral hydration Use zofran for nausea   I discussed the assessment and treatment plan with the patient. The patient was provided an opportunity to ask questions and all were answered. The patient agreed with the plan and demonstrated an understanding of the instructions.   The patient was advised to call back or seek an in-person evaluation if the symptoms worsen or if the condition fails to improve as anticipated.  Wilfred Lacy, NP

## 2020-03-31 NOTE — Patient Instructions (Signed)
Go to lab

## 2020-04-02 ENCOUNTER — Telehealth: Payer: Self-pay | Admitting: Nurse Practitioner

## 2020-04-02 NOTE — Telephone Encounter (Signed)
Patient is calling back to see if lab results were back, please advise. CB is [phone].

## 2020-04-02 NOTE — Telephone Encounter (Signed)
Pt was notified that once her lab results are resulted by Baldo Ash I would give her a call.

## 2020-05-07 ENCOUNTER — Encounter: Payer: No Typology Code available for payment source | Admitting: Nurse Practitioner

## 2020-05-27 ENCOUNTER — Encounter: Payer: No Typology Code available for payment source | Admitting: Nurse Practitioner

## 2020-06-12 ENCOUNTER — Ambulatory Visit (HOSPITAL_COMMUNITY): Payer: Self-pay

## 2020-07-02 ENCOUNTER — Telehealth: Payer: Self-pay | Admitting: Nurse Practitioner

## 2020-07-02 ENCOUNTER — Other Ambulatory Visit: Payer: Self-pay

## 2020-07-02 NOTE — Telephone Encounter (Signed)
Patient called to make same day appt, stated she was having chest pain. I transferred patient to the triage nurse and urged her to follow their directions for scheduling.

## 2020-07-03 ENCOUNTER — Ambulatory Visit (INDEPENDENT_AMBULATORY_CARE_PROVIDER_SITE_OTHER): Payer: No Typology Code available for payment source | Admitting: Nurse Practitioner

## 2020-07-03 ENCOUNTER — Other Ambulatory Visit: Payer: Self-pay | Admitting: Nurse Practitioner

## 2020-07-03 ENCOUNTER — Encounter: Payer: Self-pay | Admitting: Nurse Practitioner

## 2020-07-03 VITALS — BP 136/80 | HR 88 | Temp 97.0°F | Ht 63.0 in | Wt 196.2 lb

## 2020-07-03 DIAGNOSIS — Z23 Encounter for immunization: Secondary | ICD-10-CM | POA: Diagnosis not present

## 2020-07-03 DIAGNOSIS — R1013 Epigastric pain: Secondary | ICD-10-CM

## 2020-07-03 MED ORDER — OMEPRAZOLE 20 MG PO CPDR: 20.0000 mg | DELAYED_RELEASE_CAPSULE | Freq: Two times a day (BID) | ORAL | 0 refills | Status: DC

## 2020-07-03 MED FILL — OMEPRAZOLE 20 MG CAP: 20 | 15 days supply | Qty: 30 | Fill #0

## 2020-07-03 NOTE — Progress Notes (Signed)
Subjective:  Patient ID: Rhonda Pierce, female    DOB: December 05, 1990  Age: 29 y.o. MRN: [id number]  CC: Acute Visit (Pt c/o chest discomfort x3 days, chest burning in her chest and throat that woke her up out of her sleep. Has been having pain that comes and goes, pt states she thinks it could be a combination of anxiety, stress, and GERD. )  Gastroesophageal Reflux She complains of abdominal pain, belching, chest pain, choking, globus sensation and heartburn. She reports no coughing, no dysphagia, no early satiety, no hoarse voice, no nausea, no sore throat, no stridor, no tooth decay, no water brash or no wheezing. This is a new problem. The current episode started in the past 7 days. The problem has been waxing and waning. The heartburn is located in the substernum and abdomen. The heartburn is of moderate intensity. The heartburn wakes her from sleep. The heartburn does not limit her activity. The heartburn doesn't change with position. The symptoms are aggravated by lying down. Pertinent negatives include no anemia, fatigue, melena, muscle weakness, orthopnea or weight loss. Risk factors include lack of exercise and obesity. She has tried nothing for the symptoms. Past procedures do not include an abdominal ultrasound, an EGD, esophageal manometry, esophageal pH monitoring, H. pylori antibody titer or a UGI.   Reviewed past Medical, Social and Family history today.  Outpatient Medications Prior to Visit  Medication Sig Dispense Refill   ibuprofen (ADVIL,MOTRIN) 200 MG tablet Take 200 mg by mouth every 6 (six) hours as needed for moderate pain.     cetirizine (ZYRTEC) 10 MG tablet Take 1 tablet (10 mg total) by mouth daily for 30 days. 30 tablet 0   ondansetron (ZOFRAN) 4 MG tablet Take 1 tablet (4 mg total) by mouth every 8 (eight) hours as needed for nausea or vomiting. (Patient not taking: Reported on 07/03/2020) 20 tablet 0   No facility-administered medications prior to visit.    ROS See  HPI  Objective:  BP 136/80 (BP Location: Left Arm, Patient Position: Sitting, Cuff Size: Normal)    Pulse 88    Temp (!) 97 F (36.1 C) (Temporal)    Ht 5\' 3"  (1.6 m)    Wt 196 lb 3.2 oz (89 kg)    SpO2 99%    BMI 34.76 kg/m   Physical Exam Constitutional:      Appearance: She is obese.  Cardiovascular:     Pulses: Normal pulses.     Heart sounds: Normal heart sounds.  Pulmonary:     Effort: Pulmonary effort is normal.     Breath sounds: Normal breath sounds.  Abdominal:     General: There is no distension.     Palpations: Abdomen is soft.     Tenderness: There is abdominal tenderness in the epigastric area. There is no right CVA tenderness, left CVA tenderness or guarding. Negative signs include Murphy's sign.  Neurological:     Mental Status: She is alert and oriented to person, place, and time.  Psychiatric:        Mood and Affect: Mood normal.        Behavior: Behavior normal.    Assessment & Plan:  This visit occurred during the SARS-CoV-2 public health emergency.  Safety protocols were in place, including screening questions prior to the visit, additional usage of staff PPE, and extensive cleaning of exam room while observing appropriate contact time as indicated for disinfecting solutions.   Katherinne was seen today for acute visit.  Diagnoses  and all orders for this visit:  Dyspepsia -     omeprazole (PRILOSEC) 20 MG capsule; Take 1 capsule (20 mg total) by mouth 2 (two) times daily before a meal.  Influenza vaccine needed -     Flu Vaccine QUAD 6+ mos PF IM (Fluarix Quad PF)   Problem List Items Addressed This Visit    None    Visit Diagnoses    Dyspepsia    -  Primary   Relevant Medications   omeprazole (PRILOSEC) 20 MG capsule   Influenza vaccine needed       Relevant Orders   Flu Vaccine QUAD 6+ mos PF IM (Fluarix Quad PF)      Follow-up: Return if symptoms worsen or fail to improve.  Wilfred Lacy, NP

## 2020-07-03 NOTE — Patient Instructions (Signed)
Gastroesophageal Reflux Disease, Adult Gastroesophageal reflux (GER) happens when acid from the stomach flows up into the tube that connects the mouth and the stomach (esophagus). Normally, food travels down the esophagus and stays in the stomach to be digested. With GER, food and stomach acid sometimes move back up into the esophagus. You may have a disease called gastroesophageal reflux disease (GERD) if the reflux:  Happens often.  Causes frequent or very bad symptoms.  Causes problems such as damage to the esophagus. When this happens, the esophagus becomes sore and swollen (inflamed). Over time, GERD can make small holes (ulcers) in the lining of the esophagus. What are the causes? This condition is caused by a problem with the muscle between the esophagus and the stomach. When this muscle is weak or not normal, it does not close properly to keep food and acid from coming back up from the stomach. The muscle can be weak because of:  Tobacco use.  Pregnancy.  Having a certain type of hernia (hiatal hernia).  Alcohol use.  Certain foods and drinks, such as coffee, chocolate, onions, and peppermint. What increases the risk? You are more likely to develop this condition if you:  Are overweight.  Have a disease that affects your connective tissue.  Use NSAID medicines. What are the signs or symptoms? Symptoms of this condition include:  Heartburn.  Difficult or painful swallowing.  The feeling of having a lump in the throat.  A bitter taste in the mouth.  Bad breath.  Having a lot of saliva.  Having an upset or bloated stomach.  Belching.  Chest pain. Different conditions can cause chest pain. Make sure you see your doctor if you have chest pain.  Shortness of breath or noisy breathing (wheezing).  Ongoing (chronic) cough or a cough at night.  Wearing away of the surface of teeth (tooth enamel).  Weight loss. How is this treated? Treatment will depend on how  bad your symptoms are. Your doctor may suggest:  Changes to your diet.  Medicine.  Surgery. Follow these instructions at home: Eating and drinking   Follow a diet as told by your doctor. You may need to avoid foods and drinks such as: ? Coffee and tea (with or without caffeine). ? Drinks that contain alcohol. ? Energy drinks and sports drinks. ? Bubbly (carbonated) drinks or sodas. ? Chocolate and cocoa. ? Peppermint and mint flavorings. ? Garlic and onions. ? Horseradish. ? Spicy and acidic foods. These include peppers, chili powder, curry powder, vinegar, hot sauces, and BBQ sauce. ? Citrus fruit juices and citrus fruits, such as oranges, lemons, and limes. ? Tomato-based foods. These include red sauce, chili, salsa, and pizza with red sauce. ? Fried and fatty foods. These include donuts, french fries, potato chips, and high-fat dressings. ? High-fat meats. These include hot dogs, rib eye steak, sausage, ham, and bacon. ? High-fat dairy items, such as whole milk, butter, and cream cheese.  Eat small meals often. Avoid eating large meals.  Avoid drinking large amounts of liquid with your meals.  Avoid eating meals during the 2-3 hours before bedtime.  Avoid lying down right after you eat.  Do not exercise right after you eat. Lifestyle   Do not use any products that contain nicotine or tobacco. These include cigarettes, e-cigarettes, and chewing tobacco. If you need help quitting, ask your doctor.  Try to lower your stress. If you need help doing this, ask your doctor.  If you are overweight, lose an amount   of weight that is healthy for you. Ask your doctor about a safe weight loss goal. General instructions  Pay attention to any changes in your symptoms.  Take over-the-counter and prescription medicines only as told by your doctor. Do not take aspirin, ibuprofen, or other NSAIDs unless your doctor says it is okay.  Wear loose clothes. Do not wear anything tight  around your waist.  Raise (elevate) the head of your bed about 6 inches (15 cm).  Avoid bending over if this makes your symptoms worse.  Keep all follow-up visits as told by your doctor. This is important. Contact a doctor if:  You have new symptoms.  You lose weight and you do not know why.  You have trouble swallowing or it hurts to swallow.  You have wheezing or a cough that keeps happening.  Your symptoms do not get better with treatment.  You have a hoarse voice. Get help right away if:  You have pain in your arms, neck, jaw, teeth, or back.  You feel sweaty, dizzy, or light-headed.  You have chest pain or shortness of breath.  You throw up (vomit) and your throw-up looks like blood or coffee grounds.  You pass out (faint).  Your poop (stool) is bloody or black.  You cannot swallow, drink, or eat. Summary  If a person has gastroesophageal reflux disease (GERD), food and stomach acid move back up into the esophagus and cause symptoms or problems such as damage to the esophagus.  Treatment will depend on how bad your symptoms are.  Follow a diet as told by your doctor.  Take all medicines only as told by your doctor. This information is not intended to replace advice given to you by your health care provider. Make sure you discuss any questions you have with your health care provider. Document Revised: 03/28/2018 Document Reviewed: 03/28/2018 Elsevier Patient Education  2020 [employer] for Gastroesophageal Reflux Disease, Adult When you have gastroesophageal reflux disease (GERD), the foods you eat and your eating habits are very important. Choosing the right foods can help ease your discomfort. Think about working with a nutrition specialist (dietitian) to help you make good choices. What are tips for following this plan?  Meals  Choose healthy foods that are low in fat, such as fruits, vegetables, whole grains, low-fat dairy products, and  lean meat, fish, and poultry.  Eat small meals often instead of 3 large meals a day. Eat your meals slowly, and in a place where you are relaxed. Avoid bending over or lying down until 2-3 hours after eating.  Avoid eating meals 2-3 hours before bed.  Avoid drinking a lot of liquid with meals.  Cook foods using methods other than frying. Bake, grill, or broil food instead.  Avoid or limit: ? Chocolate. ? Peppermint or spearmint. ? Alcohol. ? Pepper. ? Black and decaffeinated coffee. ? Black and decaffeinated tea. ? Bubbly (carbonated) soft drinks. ? Caffeinated energy drinks and soft drinks.  Limit high-fat foods such as: ? Fatty meat or fried foods. ? Whole milk, cream, butter, or ice cream. ? Nuts and nut butters. ? Pastries, donuts, and sweets made with butter or shortening.  Avoid foods that cause symptoms. These foods may be different for everyone. Common foods that cause symptoms include: ? Tomatoes. ? Oranges, lemons, and limes. ? Peppers. ? Spicy food. ? Onions and garlic. ? Vinegar. Lifestyle  Maintain a healthy weight. Ask your doctor what weight is healthy for you. If you  need to lose weight, work with your doctor to do so safely.  Exercise for at least 30 minutes for 5 or more days each week, or as told by your doctor.  Wear loose-fitting clothes.  Do not smoke. If you need help quitting, ask your doctor.  Sleep with the head of your bed higher than your feet. Use a wedge under the mattress or blocks under the bed frame to raise the head of the bed. Summary  When you have gastroesophageal reflux disease (GERD), food and lifestyle choices are very important in easing your symptoms.  Eat small meals often instead of 3 large meals a day. Eat your meals slowly, and in a place where you are relaxed.  Limit high-fat foods such as fatty meat or fried foods.  Avoid bending over or lying down until 2-3 hours after eating.  Avoid peppermint and spearmint,  caffeine, alcohol, and chocolate. This information is not intended to replace advice given to you by your health care provider. Make sure you discuss any questions you have with your health care provider. Document Revised: 01/10/2019 Document Reviewed: 10/25/2016 Elsevier Patient Education  [employer].

## 2020-08-03 ENCOUNTER — Ambulatory Visit: Payer: No Typology Code available for payment source

## 2020-08-07 ENCOUNTER — Other Ambulatory Visit: Payer: Self-pay

## 2020-08-07 ENCOUNTER — Inpatient Hospital Stay: Payer: No Typology Code available for payment source | Attending: Nurse Practitioner

## 2020-08-07 DIAGNOSIS — Z23 Encounter for immunization: Secondary | ICD-10-CM

## 2020-08-07 NOTE — Progress Notes (Signed)
   [facility]ov[facility] Vaccination [facility]  Name:  Rhonda Pierce    MRN: [id number] DOB: [birth year]  08/07/2020  Rhonda Pierce was observed post Covid-19 immunization for 15 minutes without incident. She was provided with Vaccine Information Sheet and instruction to access the V-Safe system.   Rhonda Pierce was instructed to call 911 with any severe reactions post vaccine: Marland Kitchen Difficulty breathing  . Swelling of face and throat  . A fast heartbeat  . A bad rash all over body  . Dizziness and weakness

## 2020-10-09 DIAGNOSIS — R0602 Shortness of breath: Secondary | ICD-10-CM | POA: Diagnosis not present

## 2020-10-09 DIAGNOSIS — Z20822 Contact with and (suspected) exposure to covid-19: Secondary | ICD-10-CM | POA: Diagnosis not present

## 2020-10-09 DIAGNOSIS — Z114 Encounter for screening for human immunodeficiency virus [HIV]: Secondary | ICD-10-CM | POA: Diagnosis not present

## 2020-10-09 DIAGNOSIS — R7303 Prediabetes: Secondary | ICD-10-CM | POA: Diagnosis not present

## 2020-10-09 DIAGNOSIS — Z1159 Encounter for screening for other viral diseases: Secondary | ICD-10-CM | POA: Diagnosis not present

## 2020-10-09 DIAGNOSIS — R5383 Other fatigue: Secondary | ICD-10-CM | POA: Diagnosis not present

## 2020-10-09 DIAGNOSIS — E559 Vitamin D deficiency, unspecified: Secondary | ICD-10-CM | POA: Diagnosis not present

## 2020-10-09 DIAGNOSIS — E7800 Pure hypercholesterolemia, unspecified: Secondary | ICD-10-CM | POA: Diagnosis not present

## 2020-10-09 DIAGNOSIS — Z131 Encounter for screening for diabetes mellitus: Secondary | ICD-10-CM | POA: Diagnosis not present

## 2020-10-09 DIAGNOSIS — Z79899 Other long term (current) drug therapy: Secondary | ICD-10-CM | POA: Diagnosis not present

## 2020-10-09 DIAGNOSIS — Z0000 Encounter for general adult medical examination without abnormal findings: Secondary | ICD-10-CM | POA: Diagnosis not present

## 2020-10-09 DIAGNOSIS — Z13220 Encounter for screening for lipoid disorders: Secondary | ICD-10-CM | POA: Diagnosis not present

## 2020-11-16 DIAGNOSIS — E7800 Pure hypercholesterolemia, unspecified: Secondary | ICD-10-CM | POA: Diagnosis not present

## 2020-11-16 DIAGNOSIS — J302 Other seasonal allergic rhinitis: Secondary | ICD-10-CM | POA: Diagnosis not present

## 2020-11-16 DIAGNOSIS — R7302 Impaired glucose tolerance (oral): Secondary | ICD-10-CM | POA: Diagnosis not present

## 2020-11-16 DIAGNOSIS — E559 Vitamin D deficiency, unspecified: Secondary | ICD-10-CM | POA: Diagnosis not present

## 2020-11-26 DIAGNOSIS — R945 Abnormal results of liver function studies: Secondary | ICD-10-CM | POA: Diagnosis not present

## 2020-12-14 DIAGNOSIS — E7800 Pure hypercholesterolemia, unspecified: Secondary | ICD-10-CM | POA: Diagnosis not present

## 2020-12-14 DIAGNOSIS — E669 Obesity, unspecified: Secondary | ICD-10-CM | POA: Diagnosis not present

## 2020-12-14 DIAGNOSIS — Z6830 Body mass index (BMI) 30.0-30.9, adult: Secondary | ICD-10-CM | POA: Diagnosis not present

## 2020-12-14 DIAGNOSIS — K760 Fatty (change of) liver, not elsewhere classified: Secondary | ICD-10-CM | POA: Diagnosis not present

## 2021-01-16 DIAGNOSIS — R5383 Other fatigue: Secondary | ICD-10-CM | POA: Diagnosis not present

## 2021-01-16 DIAGNOSIS — E559 Vitamin D deficiency, unspecified: Secondary | ICD-10-CM | POA: Diagnosis not present

## 2021-01-16 DIAGNOSIS — Z1159 Encounter for screening for other viral diseases: Secondary | ICD-10-CM | POA: Diagnosis not present

## 2021-01-16 DIAGNOSIS — Z79899 Other long term (current) drug therapy: Secondary | ICD-10-CM | POA: Diagnosis not present

## 2021-01-16 DIAGNOSIS — E7800 Pure hypercholesterolemia, unspecified: Secondary | ICD-10-CM | POA: Diagnosis not present

## 2021-01-16 DIAGNOSIS — Z6830 Body mass index (BMI) 30.0-30.9, adult: Secondary | ICD-10-CM | POA: Diagnosis not present

## 2021-01-16 DIAGNOSIS — E669 Obesity, unspecified: Secondary | ICD-10-CM | POA: Diagnosis not present

## 2021-01-16 DIAGNOSIS — K760 Fatty (change of) liver, not elsewhere classified: Secondary | ICD-10-CM | POA: Diagnosis not present

## 2021-01-16 DIAGNOSIS — R7302 Impaired glucose tolerance (oral): Secondary | ICD-10-CM | POA: Diagnosis not present

## 2021-02-14 DIAGNOSIS — E7800 Pure hypercholesterolemia, unspecified: Secondary | ICD-10-CM | POA: Diagnosis not present

## 2021-02-14 DIAGNOSIS — K760 Fatty (change of) liver, not elsewhere classified: Secondary | ICD-10-CM | POA: Diagnosis not present

## 2021-02-14 DIAGNOSIS — Z6830 Body mass index (BMI) 30.0-30.9, adult: Secondary | ICD-10-CM | POA: Diagnosis not present

## 2021-02-14 DIAGNOSIS — E669 Obesity, unspecified: Secondary | ICD-10-CM | POA: Diagnosis not present

## 2021-04-01 DIAGNOSIS — K760 Fatty (change of) liver, not elsewhere classified: Secondary | ICD-10-CM | POA: Diagnosis not present

## 2021-04-01 DIAGNOSIS — E7800 Pure hypercholesterolemia, unspecified: Secondary | ICD-10-CM | POA: Diagnosis not present

## 2021-04-01 DIAGNOSIS — E663 Overweight: Secondary | ICD-10-CM | POA: Diagnosis not present

## 2021-04-01 DIAGNOSIS — Z6829 Body mass index (BMI) 29.0-29.9, adult: Secondary | ICD-10-CM | POA: Diagnosis not present

## 2021-05-08 DIAGNOSIS — R7302 Impaired glucose tolerance (oral): Secondary | ICD-10-CM | POA: Diagnosis not present

## 2021-05-08 DIAGNOSIS — Z79899 Other long term (current) drug therapy: Secondary | ICD-10-CM | POA: Diagnosis not present

## 2021-05-08 DIAGNOSIS — K760 Fatty (change of) liver, not elsewhere classified: Secondary | ICD-10-CM | POA: Diagnosis not present

## 2021-05-08 DIAGNOSIS — E7800 Pure hypercholesterolemia, unspecified: Secondary | ICD-10-CM | POA: Diagnosis not present

## 2021-05-08 DIAGNOSIS — Z6830 Body mass index (BMI) 30.0-30.9, adult: Secondary | ICD-10-CM | POA: Diagnosis not present

## 2021-05-08 DIAGNOSIS — E559 Vitamin D deficiency, unspecified: Secondary | ICD-10-CM | POA: Diagnosis not present

## 2021-05-08 DIAGNOSIS — R5383 Other fatigue: Secondary | ICD-10-CM | POA: Diagnosis not present

## 2021-05-08 DIAGNOSIS — E669 Obesity, unspecified: Secondary | ICD-10-CM | POA: Diagnosis not present

## 2021-05-08 DIAGNOSIS — Z1159 Encounter for screening for other viral diseases: Secondary | ICD-10-CM | POA: Diagnosis not present

## 2021-05-24 ENCOUNTER — Other Ambulatory Visit (HOSPITAL_COMMUNITY): Payer: Self-pay

## 2021-05-24 MED ORDER — QUICKVUE AT-HOME COVID-19 TEST VI KIT
PACK | 0 refills | Status: DC
  Filled 2021-05-24: qty 2, 2d supply, fill #0

## 2021-06-04 DIAGNOSIS — K760 Fatty (change of) liver, not elsewhere classified: Secondary | ICD-10-CM | POA: Diagnosis not present

## 2021-06-04 DIAGNOSIS — E7800 Pure hypercholesterolemia, unspecified: Secondary | ICD-10-CM | POA: Diagnosis not present

## 2021-06-04 DIAGNOSIS — E669 Obesity, unspecified: Secondary | ICD-10-CM | POA: Diagnosis not present

## 2021-06-04 DIAGNOSIS — R7302 Impaired glucose tolerance (oral): Secondary | ICD-10-CM | POA: Diagnosis not present

## 2021-06-04 DIAGNOSIS — Z6830 Body mass index (BMI) 30.0-30.9, adult: Secondary | ICD-10-CM | POA: Diagnosis not present

## 2022-06-17 ENCOUNTER — Inpatient Hospital Stay (HOSPITAL_COMMUNITY)
Admission: AD | Admit: 2022-06-17 | Discharge: 2022-06-17 | Disposition: A | Discharge status: Home / Self Care | Payer: 59 | Attending: Obstetrics & Gynecology | Admitting: Obstetrics & Gynecology

## 2022-06-17 ENCOUNTER — Inpatient Hospital Stay (HOSPITAL_COMMUNITY): Payer: 59

## 2022-06-17 ENCOUNTER — Encounter (HOSPITAL_COMMUNITY): Payer: Self-pay | Admitting: Obstetrics & Gynecology

## 2022-06-17 ENCOUNTER — Telehealth: Payer: Self-pay | Admitting: Obstetrics and Gynecology

## 2022-06-17 DIAGNOSIS — Z6790 Unspecified blood type, Rh positive: Secondary | ICD-10-CM | POA: Insufficient documentation

## 2022-06-17 DIAGNOSIS — D259 Leiomyoma of uterus, unspecified: Secondary | ICD-10-CM | POA: Diagnosis not present

## 2022-06-17 DIAGNOSIS — O039 Complete or unspecified spontaneous abortion without complication: Secondary | ICD-10-CM | POA: Insufficient documentation

## 2022-06-17 DIAGNOSIS — O3411 Maternal care for benign tumor of corpus uteri, first trimester: Secondary | ICD-10-CM | POA: Insufficient documentation

## 2022-06-17 HISTORY — DX: Complete or unspecified spontaneous abortion without complication: O03.9

## 2022-06-17 LAB — CBC
HCT: 33.6 % — ABNORMAL LOW (ref 36.0–46.0)
Hemoglobin: 11.1 g/dL — ABNORMAL LOW (ref 12.0–15.0)
MCH: 24.3 pg — ABNORMAL LOW (ref 26.0–34.0)
MCHC: 33 g/dL (ref 30.0–36.0)
MCV: 73.5 fL — ABNORMAL LOW (ref 80.0–100.0)
Platelets: 343 10*3/uL (ref 150–400)
RBC: 4.57 MIL/uL (ref 3.87–5.11)
RDW: 14 % (ref 11.5–15.5)
WBC: 10.9 10*3/uL — ABNORMAL HIGH (ref 4.0–10.5)
nRBC: 0 % (ref 0.0–0.2)

## 2022-06-17 LAB — HCG, QUANTITATIVE, PREGNANCY: hCG, Beta Chain, Quant, S: 87 m[IU]/mL — ABNORMAL HIGH (ref <5)

## 2022-06-17 NOTE — Telephone Encounter (Signed)
Had an ultrasound with Dr Cletis Media last week which confirmed a miscarriage. Started having bleeding a week ago, and stopped bleeding today but having some spotting. She did pass larger blood clots last week the size of her palm. She has a follow up with Dr Cletis Media next Thursday for repeat ultrasound.  She was prescribed oxycodone and did not help with the cramping pain, still having intense pain. Denies fever or chills. Advised her to proceed to the emergency room for further evaluation. She will likely need an ultrasound to confirm she has passed products of conception. She states she will proceed there now.   Jaquita Folds, MD

## 2022-06-17 NOTE — MAU Provider Note (Signed)
History     [id number]  Arrival date and time: 06/17/22 1653    Chief Complaint  Patient presents with   Vaginal Bleeding   Abdominal Pain     HPI Rhonda Pierce is a 31 y.o. at 60w4dwho presents for vaginal bleeding & abdominal pain. Patient diagnosed with miscarriage last week. States she thinks she passed tissue over the weekend. Bleeding & pain improved after that. Continues to have some spotting but not bleeding into a pad or passing clots. Reports increase in abdominal cramping since this morning that has not been well controlled with ibuprofen & oxycodone. Denies fever. No intercourse since miscarriage.  Has f/u with Dr. RCletis Medianext Thursday.    OB History     Gravida  3   Para  1   Term  1   Preterm  0   AB  1   Living  1      SAB      IAB  1   Ectopic      Multiple  0   Live Births  1           Past Medical History:  Diagnosis Date   Medical history non-contributory     Past Surgical History:  Procedure Laterality Date   THERAPEUTIC ABORTION      Family History  Problem Relation Age of Onset   Cancer Mother        lung   Cancer Maternal Grandfather        lung    No Known Allergies  No current facility-administered medications on file prior to encounter.   Current Outpatient Medications on File Prior to Encounter  Medication Sig Dispense Refill   cetirizine (ZYRTEC) 10 MG tablet Take 1 tablet (10 mg total) by mouth daily for 30 days. 30 tablet 0   COVID-19 At Home Antigen Test (QUICKVUE AT-HOME COVID-19 TEST) KIT Use as directed. 2 each 0   ibuprofen (ADVIL,MOTRIN) 200 MG tablet Take 200 mg by mouth every 6 (six) hours as needed for moderate pain.     omeprazole (PRILOSEC) 20 MG capsule TAKE 1 CAPSULE BY MOUTH TWO TIMES DAILY 30 capsule 0   ondansetron (ZOFRAN) 4 MG tablet Take 1 tablet (4 mg total) by mouth every 8 (eight) hours as needed for nausea or vomiting. (Patient not taking: Reported on 07/03/2020) 20 tablet 0      ROS Pertinent positives and negative per HPI, all others reviewed and negative  Physical Exam   BP 120/83 (BP Location: Right Arm)   Pulse 93   Temp 98.4 F (36.9 C) (Oral)   Resp 17   Ht '5\' 2"'  (1.575 m)   Wt 87 kg   LMP 03/21/2022   SpO2 99%   BMI 35.08 kg/m   Patient Vitals for the past 24 hrs:  BP Temp Temp src Pulse Resp SpO2 Height Weight  06/17/22 1719 120/83 98.4 F (36.9 C) Oral 93 17 99 % '5\' 2"'  (1.575 m) 87 kg    Physical Exam Vitals and nursing note reviewed.  Constitutional:      General: She is not in acute distress.    Appearance: She is well-developed.  HENT:     Head: Normocephalic and atraumatic.  Pulmonary:     Effort: Pulmonary effort is normal. No respiratory distress.  Neurological:     Mental Status: She is alert.  Psychiatric:        Mood and Affect: Mood normal.  Behavior: Behavior normal.       Labs Results for orders placed or performed during the hospital encounter of 06/17/22 (from the past 24 hour(s))  CBC     Status: Abnormal   Collection Time: 06/17/22  6:01 PM  Result Value Ref Range   WBC 10.9 (H) 4.0 - 10.5 K/uL   RBC 4.57 3.87 - 5.11 MIL/uL   Hemoglobin 11.1 (L) 12.0 - 15.0 g/dL   HCT 33.6 (L) 36.0 - 46.0 %   MCV 73.5 (L) 80.0 - 100.0 fL   MCH 24.3 (L) 26.0 - 34.0 pg   MCHC 33.0 30.0 - 36.0 g/dL   RDW 14.0 11.5 - 15.5 %   Platelets 343 150 - 400 K/uL   nRBC 0.0 0.0 - 0.2 %  hCG, quantitative, pregnancy     Status: Abnormal   Collection Time: 06/17/22  6:01 PM  Result Value Ref Range   hCG, Beta Chain, Quant, S 87 (H) <5 mIU/mL    Imaging US PELVIC COMPLETE WITH TRANSVAGINAL  Result Date: 06/17/2022 CLINICAL DATA:  Abdominal pain, vaginal bleeding EXAM: TRANSABDOMINAL AND TRANSVAGINAL ULTRASOUND OF PELVIS TECHNIQUE: Both transabdominal and transvaginal ultrasound examinations of the pelvis were performed. Transabdominal technique was performed for global imaging of the pelvis including uterus, ovaries,  adnexal regions, and pelvic cul-de-sac. It was necessary to proceed with endovaginal exam following the transabdominal exam to visualize the endometrium and ovaries. COMPARISON:  None Available. FINDINGS: Uterus Measurements: 10.7 by 6 x 7 x 7.3 cm = volume: 259.7 ML. There is 8 x 7 x 7 cm heterogeneous echogenicity in the fundus suggesting a fibroid. Endometrium Thickness: Endometrium is not adequately visualized due to large uterine fibroid. Right ovary Measurements: 4.6 x 3 x 4 cm = volume: 26.03 mL. Evaluation is limited due to large uterine fibroid obscuring the right adnexa. Left ovary Measurements: 2.8 x 1.3 x 1.6 cm = volume: 3.22 mL. Normal appearance/no adnexal mass. Other findings No abnormal free fluid. IMPRESSION: There is 8 cm fibroid with inhomogeneous echogenicity in the fundus of the uterus. Endometrium is not adequately visualized for evaluation. There are no dominant adnexal masses. There is no free fluid in pelvis. Electronically Signed   By: Elmer Picker M.D.   On: 06/17/2022 19:14    MAU Course  Procedures Lab Orders         CBC         hCG, quantitative, pregnancy    No orders of the defined types were placed in this encounter.  Imaging Orders         US PELVIC COMPLETE WITH TRANSVAGINAL     MDM Reviewed records from Dr. Boyd Kerbs office under care everywhere. Patient should be 14w4dby LMP & early ultrasounds. Had ultrasound done last week that showed missed abortion measuring 8 wks.   RH positive  HCG today is 87. Hemoglobin is stable.  Ultrasound shows no signs of retained products. Continues to have 8 cm fibroid. Patient thinks pain is related to her fibroid which is likely. She has pain medication at home & has f/u scheduled.  Assessment and Plan   1. Complete miscarriage   2. Uterine leiomyoma, unspecified location    -Continue meds as prescribed -Keep f/u appointment with Dr. RKarle Plumber NP 06/17/22 7:38 PM

## 2022-06-17 NOTE — Discharge Instructions (Signed)
Return to care  If you have heavier bleeding that soaks through more than 2 pads per hour for an hour or more If you bleed so much that you feel like you might pass out or you do pass out If you have significant abdominal pain that is not improved with Tylenol   

## 2022-06-17 NOTE — MAU Note (Signed)
Rhonda Pierce is a 31 y.o. at 38w4dhere in MAU reporting: dx with missed AB on Thurs, started bleeding on Friday. Went through process naturally.  Is just spotting now.  Is still cramping, meds that were prescribed are not really helping, became worse this morning. Not sure if it is from the miscarriage or her fibroids.   Onset of complaint: last Friday Pain score: 9 Vitals:   06/17/22 1719  BP: 120/83  Pulse: 93  Resp: 17  Temp: 98.4 F (36.9 C)  SpO2: 99%      Lab orders placed from triage:

## 2022-07-25 ENCOUNTER — Encounter: Payer: Self-pay | Admitting: *Deleted

## 2022-10-27 NOTE — H&P (Signed)
Rhonda Pierce is a 32 y.o. female P: 1-0-2-1  who presents for myomectomy because of  a large uterine fibroid.  In August of 2023 the patient had a pregnancy that resulted in miscarriage at 8 weeks. During that evaluation she was found to have a uterine fibroid. A follow up ultrasound in September 2023 revealed a uterus: 10.7 x 6.7 x 7.3 cm, endometrium obscured by 8 cm fundal fibroid; uterine volume-259 cm; right ovary-4.6 cm and left ovary-2.8 cm. The patient has regular menstrual periods that last for 7 days with pad change every 1.5 hours and cramping rated 7/10 relieved with Tylenol. She denies any dyspareunia, changes in bowel or bladder function or vaginitis symptoms. In an effort to decrease the size of her fibroid and proceed with minimally invasive surgery,  the patient received Lupron Depot 11.25 mg IM September 19, 2022. Given her desire to maintain fertility and the size of her uterine fibroid, the patient wants to proceed with surgical removal of her fibroid.    Past Medical History  OB History: G: 3; P: 1-0-2-1; SVB: 2017  GYN History: menarche: 32YO;    LMP: 09/24/2022;    Contracepton no method ;  Last PAP smear: 2023-normal  Medical History: Miscarriage, Uterine Fibroids, HgbE trait, Hypercholesterolemia, Fatty Liver, Impaired Glucose Tolerance, Vitamin D Deficiency, Migraine and Anemia.   Surgical History: 2023: Excision of Back Cyst-benign Denies history of blood transfusions  Family History: Lung Cancer  Social History: Single  and employed as an Marketing executive for [employer];    Former smoker and denies alcohol intake   Medications: Lupron Depot 11.25 mg IM (last given 09/19/2022)  No Known Allergies  Denies sensitivity to peanuts, shellfish, soy, latex or adhesives.   ROS: Admits to glasses, occasional headaches but denies vision changes, nasal congestion, dysphagia, tinnitus, dizziness, hoarseness, cough,  chest pain, shortness of breath, nausea, vomiting,  diarrhea,constipation,  urinary frequency, urgency  dysuria, hematuria, vaginitis symptoms, pelvic pain, swelling of joints,easy bruising,  myalgias, arthralgias, skin rashes, unexplained weight loss and except as is mentioned in the history of present illness, patient's review of systems is otherwise negative.    Physical Exam  Bp: 120/90;  Weight: 193.8 lbs.; Height: 5'2"; BMI: 35.4  Neck: supple without masses or thyromegaly Lungs: clear to auscultation Heart: regular rate and rhythm Abdomen: soft, non-tender and no organomegaly Pelvic:EGBUS- wnl; vagina-normal rugae; uterus-exam limited by habitus; cervix without lesions or motion tenderness; adnexae-no tenderness or masses Extremities:  no clubbing, cyanosis or edema   Assesment: Symptomatic Uterine Fibroid   Disposition:  The patient was given the indication for her procedures along with the risks and benefits. A Robot Assisted Myomectomy benefits include lesser postoperative pain, less blood loss during surgery, reduced risk of injury to other organs due to better visualization with a 3-D HD 10 times magnifying camera, shorter hospital stay between 0-1 night and rapid recovery with return to daily routine in 2-3 weeks. Although the robot-assisted myomectomy has a longer operative time than traditional laparotomy, in a patient with good medical history, the benefits usually outweigh the risks.  Risks include but are not limited to bleeding, infection, injury to other organs, need for laparotomy, if the endometrial cavity is breached then a C-section delivery would be warranted and transient post-operative facial edema.  Also discussed: 1. Lack of tactile feedback with the robotic approach leaving non-visible fibroids impossible to resect with potential future growth. 2. Need for cesarean section with 2 previous c/s AND myomectomy 3. Risk of developing NEW  fibroids estimated at 25 %  The patient verbalized understanding of these  risks and has consented to proceed with a Robot Assisted Laparoscopic Myomectomy with Manual Morcellation on November 10, 2022 at [facility]  CSN# [id number]   Roben Schliep J. Florene Glen, PA-C  for Dr. Dede Query. Rivard

## 2022-10-31 ENCOUNTER — Encounter (HOSPITAL_BASED_OUTPATIENT_CLINIC_OR_DEPARTMENT_OTHER): Payer: Self-pay | Admitting: Obstetrics and Gynecology

## 2022-10-31 ENCOUNTER — Other Ambulatory Visit: Payer: Self-pay

## 2022-10-31 DIAGNOSIS — D259 Leiomyoma of uterus, unspecified: Secondary | ICD-10-CM | POA: Diagnosis not present

## 2022-10-31 DIAGNOSIS — D508 Other iron deficiency anemias: Secondary | ICD-10-CM | POA: Diagnosis not present

## 2022-10-31 DIAGNOSIS — Z01812 Encounter for preprocedural laboratory examination: Secondary | ICD-10-CM | POA: Diagnosis present

## 2022-10-31 NOTE — Progress Notes (Signed)
Your procedure is scheduled on Thursday, 11/10/22.  Report to [facility]   Call this number if you have problems the morning of surgery  :[phone].   OUR ADDRESS IS [address].  WE ARE LOCATED IN [facility].  PLEASE BRING YOUR INSURANCE CARD AND PHOTO ID DAY OF SURGERY.  ONLY 2 PEOPLE ARE ALLOWED IN  WAITING  ROOM.                                      REMEMBER:  DO NOT EAT FOOD, CANDY GUM OR MINTS  AFTER MIDNIGHT THE NIGHT BEFORE YOUR SURGERY . YOU MAY HAVE CLEAR LIQUIDS FROM MIDNIGHT THE NIGHT BEFORE YOUR SURGERY UNTIL  4:30 AM. NO CLEAR LIQUIDS AFTER   4:30 AM DAY OF SURGERY.  YOU MAY  BRUSH YOUR TEETH MORNING OF SURGERY AND RINSE YOUR MOUTH OUT, NO CHEWING GUM CANDY OR MINTS.     CLEAR LIQUID DIET   Foods Allowed                                                                     Foods Excluded  Coffee and tea, regular and decaf                             liquids that you cannot  Plain Jell-O                                                                   see through such as: Fruit ices (not with fruit pulp)                                     milk, soups, orange juice  Plain  Popsicles                                    All solid food Carbonated beverages, regular and diet                                    Cranberry, grape and apple juices Sports drinks like Gatorade _____________________________________________________________________     TAKE ONLY THESE MEDICATIONS MORNING OF SURGERY: NONE  IF YOU HAVE AN EXTENDED RECOVERY PERIOD OR YOU STAY OVERNIGHT:  UP TO 4 VISITORS  MAY VISIT IN THE EXTENDED RECOVERY ROOM UNTIL 800 PM ONLY.  ONE  VISITOR AGE 32 AND OVER MAY SPEND THE NIGHT AND MUST BE IN EXTENDED RECOVERY ROOM NO LATER THAN 800 PM . YOUR DISCHARGE TIME AFTER YOU SPEND THE NIGHT IS 900 AM THE MORNING AFTER YOUR SURGERY.  YOU MAY PACK  A SMALL OVERNIGHT BAG WITH TOILETRIES FOR YOUR OVERNIGHT STAY IF  YOU WISH.  YOUR PRESCRIPTION MEDICATIONS WILL BE PROVIDED DURING [place].                                      DO NOT WEAR JEWERLY, BODY PIERCINGS, OR MAKE UP. DO NOT WEAR LOTIONS, POWDERS, PERFUMES OR NAIL POLISH ON YOUR FINGERNAILS. TOENAIL POLISH IS OK TO WEAR. DO NOT SHAVE FOR 48 HOURS PRIOR TO DAY OF SURGERY.  CONTACTS, GLASSES, OR DENTURES MAY NOT BE WORN TO SURGERY.  REMEMBER: NO SMOKING, DRUGS OR ALCOHOL FOR 24 HOURS BEFORE YOUR SURGERY.                                    [facility] IS NOT RESPONSIBLE  FOR ANY BELONGINGS.                                                                    Marland Kitchen           [facility] - Preparing for Surgery Before surgery, you can play an important role.  Because skin is not sterile, your skin needs to be as free of germs as possible.  You can reduce the number of germs on your skin by washing with CHG (chlorahexidine gluconate) soap before surgery.  CHG is an antiseptic cleaner which kills germs and bonds with the skin to continue killing germs even after washing. Please DO NOT use if you have an allergy to CHG or antibacterial soaps.  If your skin becomes reddened/irritated stop using the CHG and inform your nurse when you arrive at Short Stay. Do not shave (including legs and underarms) for at least 48 hours prior to the first CHG shower.  You may shave your face/neck. Please follow these instructions carefully:  1.  Shower with CHG Soap the night before surgery and the  morning of Surgery.  2.  If you choose to wash your hair, wash your hair first as usual with your  normal  shampoo.  3.  After you shampoo, rinse your hair and body thoroughly to remove the  shampoo.                                        4.  Use CHG as you would any other liquid soap.  You can apply chg directly  to the skin and wash , chg soap provided, night before and morning of your surgery.  5.  Apply the CHG Soap to your body ONLY FROM THE NECK DOWN.   Do not use on  face/ open                           Wound or open sores. Avoid contact with eyes, ears mouth and genitals (private parts).                       Wash face,  Genitals (  private parts) with your normal soap.             6.  Wash thoroughly, paying special attention to the area where your surgery  will be performed.  7.  Thoroughly rinse your body with warm water from the neck down.  8.  DO NOT shower/wash with your normal soap after using and rinsing off  the CHG Soap.             9.  Pat yourself dry with a clean towel.            10.  Wear clean pajamas.            11.  Place clean sheets on your bed the night of your first shower and do not  sleep with pets. Day of Surgery : Do not apply any lotions/deodorants the morning of surgery.  Please wear clean clothes to the hospital/surgery center.  IF YOU HAVE ANY SKIN IRRITATION OR PROBLEMS WITH THE SURGICAL SOAP, PLEASE GET A BAR OF GOLD DIAL SOAP AND SHOWER THE NIGHT BEFORE YOUR SURGERY AND THE MORNING OF YOUR SURGERY. PLEASE LET THE NURSE KNOW MORNING OF YOUR SURGERY IF YOU HAD ANY PROBLEMS WITH THE SURGICAL SOAP.   ________________________________________________________________________                                                        QUESTIONS [place] [facility] PRE OP NURSE PHONE [phone].

## 2022-10-31 NOTE — Progress Notes (Signed)
Spoke w/ via phone for pre-op interview---Rhonda Pierce needs dos---- urine pregnancy per anesthesia, surgeon orders pending as of 10/31/22              Pierce results------11/07/22 Pierce appt for cbc, type & screen COVID test -----patient states asymptomatic no test needed Arrive at -------0530 on Thursday, 11/10/22 NPO after MN NO Solid Food.  Clear liquids from MN until---0430 Med rec completed Medications to take morning of surgery -----none Diabetic medication -----n/a Patient instructed no nail polish to be worn day of surgery Patient instructed to bring photo id and insurance card day of surgery Patient aware to have Driver (ride ) / caregiver    for 24 hours after surgery - boyfriend, De[place] Patient Special Instructions -----Extended / overnight stay instructions given. Pre-Op special Istructions -----Requested orders from Dr. Cletis Media on 10/28/22 via Epic IB. Patient verbalized understanding of instructions that were given at this phone interview. Patient denies shortness of breath, chest pain, fever, cough at this phone interview.

## 2022-11-07 ENCOUNTER — Encounter (HOSPITAL_COMMUNITY)
Admission: RE | Admit: 2022-11-07 | Discharge: 2022-11-07 | Disposition: A | Discharge status: Home / Self Care | Payer: 59 | Source: Ambulatory Visit | Attending: Obstetrics and Gynecology | Admitting: Obstetrics and Gynecology

## 2022-11-07 DIAGNOSIS — D508 Other iron deficiency anemias: Secondary | ICD-10-CM

## 2022-11-07 DIAGNOSIS — D259 Leiomyoma of uterus, unspecified: Secondary | ICD-10-CM | POA: Insufficient documentation

## 2022-11-07 DIAGNOSIS — Z01812 Encounter for preprocedural laboratory examination: Secondary | ICD-10-CM | POA: Diagnosis not present

## 2022-11-07 LAB — CBC
HCT: 40.1 % (ref 36.0–46.0)
Hemoglobin: 12.9 g/dL (ref 12.0–15.0)
MCH: 23.4 pg — ABNORMAL LOW (ref 26.0–34.0)
MCHC: 32.2 g/dL (ref 30.0–36.0)
MCV: 72.6 fL — ABNORMAL LOW (ref 80.0–100.0)
Platelets: 269 10*3/uL (ref 150–400)
RBC: 5.52 MIL/uL — ABNORMAL HIGH (ref 3.87–5.11)
RDW: 14.4 % (ref 11.5–15.5)
WBC: 8.5 10*3/uL (ref 4.0–10.5)
nRBC: 0 % (ref 0.0–0.2)

## 2022-11-09 NOTE — Anesthesia Preprocedure Evaluation (Signed)
Anesthesia Evaluation  Patient identified by MRN, date of birth, ID band Patient awake    Reviewed: Allergy & Precautions, NPO status , Patient's Chart, lab work & pertinent test results  History of Anesthesia Complications Negative for: history of anesthetic complications  Airway Mallampati: III  TM Distance: >3 FB Neck ROM: Full    Dental no notable dental hx. (+) Dental Advisory Given   Pulmonary former smoker   Pulmonary exam normal        Cardiovascular negative cardio ROS Normal cardiovascular exam     Neuro/Psych  Headaches    GI/Hepatic negative GI ROS, Neg liver ROS,,,  Endo/Other  negative endocrine ROS    Renal/GU negative Renal ROS     Musculoskeletal negative musculoskeletal ROS (+)    Abdominal   Peds  Hematology negative hematology ROS (+)   Anesthesia Other Findings   Reproductive/Obstetrics                             Anesthesia Physical Anesthesia Plan  ASA: 2  Anesthesia Plan: General   Post-op Pain Management: Tylenol PO (pre-op)* and Toradol IV (intra-op)*   Induction: Intravenous  PONV Risk Score and Plan: 4 or greater and Ondansetron, Dexamethasone, Propofol infusion, Midazolam and Scopolamine patch - Pre-op  Airway Management Planned: Oral ETT  Additional Equipment:   Intra-op Plan:   Post-operative Plan: Extubation in OR  Informed Consent: I have reviewed the patients History and Physical, chart, labs and discussed the procedure including the risks, benefits and alternatives for the proposed anesthesia with the patient or authorized representative who has indicated his/her understanding and acceptance.     Dental advisory given  Plan Discussed with: Anesthesiologist and CRNA  Anesthesia Plan Comments:         Anesthesia Quick Evaluation

## 2022-11-10 ENCOUNTER — Encounter (HOSPITAL_BASED_OUTPATIENT_CLINIC_OR_DEPARTMENT_OTHER): Payer: Self-pay | Admitting: Obstetrics and Gynecology

## 2022-11-10 ENCOUNTER — Ambulatory Visit (HOSPITAL_BASED_OUTPATIENT_CLINIC_OR_DEPARTMENT_OTHER)
Admission: RE | Admit: 2022-11-10 | Discharge: 2022-11-10 | Disposition: A | Discharge status: Home / Self Care | Payer: 59 | Attending: Obstetrics and Gynecology | Admitting: Obstetrics and Gynecology

## 2022-11-10 ENCOUNTER — Ambulatory Visit (HOSPITAL_BASED_OUTPATIENT_CLINIC_OR_DEPARTMENT_OTHER): Payer: 59 | Admitting: Anesthesiology

## 2022-11-10 ENCOUNTER — Other Ambulatory Visit: Payer: Self-pay

## 2022-11-10 ENCOUNTER — Encounter (HOSPITAL_BASED_OUTPATIENT_CLINIC_OR_DEPARTMENT_OTHER)
Admission: RE | Disposition: A | Discharge status: Home / Self Care | Payer: Self-pay | Source: Home / Self Care | Attending: Obstetrics and Gynecology

## 2022-11-10 DIAGNOSIS — D251 Intramural leiomyoma of uterus: Secondary | ICD-10-CM | POA: Diagnosis not present

## 2022-11-10 DIAGNOSIS — D259 Leiomyoma of uterus, unspecified: Secondary | ICD-10-CM | POA: Diagnosis not present

## 2022-11-10 DIAGNOSIS — Z87891 Personal history of nicotine dependence: Secondary | ICD-10-CM | POA: Insufficient documentation

## 2022-11-10 DIAGNOSIS — Z01818 Encounter for other preprocedural examination: Secondary | ICD-10-CM

## 2022-11-10 DIAGNOSIS — D508 Other iron deficiency anemias: Secondary | ICD-10-CM

## 2022-11-10 HISTORY — DX: Headache, unspecified: R51.9

## 2022-11-10 HISTORY — DX: Pure hypercholesterolemia, unspecified: E78.00

## 2022-11-10 HISTORY — DX: Anemia, unspecified: D64.9

## 2022-11-10 HISTORY — DX: Presence of spectacles and contact lenses: Z97.3

## 2022-11-10 HISTORY — PX: ROBOT ASSISTED MYOMECTOMY: SHX5142

## 2022-11-10 LAB — TYPE AND SCREEN
ABO/RH(D): O POS
Antibody Screen: NEGATIVE

## 2022-11-10 LAB — POCT PREGNANCY, URINE: Preg Test, Ur: NEGATIVE

## 2022-11-10 SURGERY — MYOMECTOMY, ROBOT-ASSISTED
Anesthesia: General | Site: Abdomen

## 2022-11-10 MED ORDER — VASOPRESSIN 20 UNIT/ML IV SOLN
INTRAVENOUS | Status: DC | PRN
  Administered 2022-11-10: 23 mL via SURGICAL_CAVITY

## 2022-11-10 MED ORDER — IBUPROFEN 200 MG PO TABS
ORAL_TABLET | ORAL | Status: AC
  Filled 2022-11-10: qty 3

## 2022-11-10 MED ORDER — HEMOSTATIC AGENTS (NO CHARGE) OPTIME
TOPICAL | Status: DC | PRN
  Administered 2022-11-10: 1

## 2022-11-10 MED ORDER — CELECOXIB 200 MG PO CAPS
ORAL_CAPSULE | ORAL | Status: AC
  Filled 2022-11-10: qty 2

## 2022-11-10 MED ORDER — KETAMINE HCL 10 MG/ML IJ SOLN
INTRAMUSCULAR | Status: DC | PRN
  Administered 2022-11-10: 10 mg via INTRAVENOUS
  Administered 2022-11-10: 20 mg via INTRAVENOUS

## 2022-11-10 MED ORDER — OXYCODONE HCL 5 MG PO TABS
5.0000 mg | ORAL_TABLET | ORAL | Status: DC | PRN
  Administered 2022-11-10: 5 mg via ORAL

## 2022-11-10 MED ORDER — POVIDONE-IODINE 10 % EX SWAB: 2.0000 | Freq: Once | CUTANEOUS | Status: DC

## 2022-11-10 MED ORDER — PHENYLEPHRINE 80 MCG/ML (10ML) SYRINGE FOR IV PUSH (FOR BLOOD PRESSURE SUPPORT)
PREFILLED_SYRINGE | INTRAVENOUS | Status: DC | PRN
  Administered 2022-11-10: 80 ug via INTRAVENOUS
  Administered 2022-11-10: 160 ug via INTRAVENOUS
  Administered 2022-11-10 (×3): 80 ug via INTRAVENOUS
  Administered 2022-11-10: 160 ug via INTRAVENOUS
  Administered 2022-11-10: 240 ug via INTRAVENOUS
  Administered 2022-11-10: 160 ug via INTRAVENOUS
  Administered 2022-11-10: 80 ug via INTRAVENOUS

## 2022-11-10 MED ORDER — SODIUM CHLORIDE (PF) 0.9 % IJ SOLN
INTRAMUSCULAR | Status: DC | PRN
  Administered 2022-11-10: 120 mL via SURGICAL_CAVITY

## 2022-11-10 MED ORDER — ROCURONIUM BROMIDE 10 MG/ML (PF) SYRINGE
PREFILLED_SYRINGE | INTRAVENOUS | Status: AC
  Filled 2022-11-10: qty 10

## 2022-11-10 MED ORDER — ENSURE PRE-SURGERY PO LIQD: 296.0000 mL | Freq: Once | ORAL | Status: DC

## 2022-11-10 MED ORDER — LIDOCAINE HCL (PF) 2 % IJ SOLN
INTRAMUSCULAR | Status: DC | PRN
  Administered 2022-11-10: 1.5 mg/kg/h via INTRADERMAL

## 2022-11-10 MED ORDER — FENTANYL CITRATE (PF) 100 MCG/2ML IJ SOLN
INTRAMUSCULAR | Status: AC
  Filled 2022-11-10: qty 2

## 2022-11-10 MED ORDER — ACETAMINOPHEN 500 MG PO TABS: 1000.0000 mg | ORAL_TABLET | Freq: Once | ORAL | Status: AC

## 2022-11-10 MED ORDER — SIMETHICONE 80 MG PO CHEW: 80.0000 mg | CHEWABLE_TABLET | Freq: Four times a day (QID) | ORAL | Status: DC | PRN

## 2022-11-10 MED ORDER — PHENYLEPHRINE 80 MCG/ML (10ML) SYRINGE FOR IV PUSH (FOR BLOOD PRESSURE SUPPORT)
PREFILLED_SYRINGE | INTRAVENOUS | Status: AC
  Filled 2022-11-10: qty 10

## 2022-11-10 MED ORDER — SCOPOLAMINE 1 MG/3DAYS TD PT72
MEDICATED_PATCH | TRANSDERMAL | Status: AC
  Filled 2022-11-10: qty 1

## 2022-11-10 MED ORDER — KETAMINE HCL 50 MG/5ML IJ SOSY
PREFILLED_SYRINGE | INTRAMUSCULAR | Status: AC
  Filled 2022-11-10: qty 5

## 2022-11-10 MED ORDER — LACTATED RINGERS IV SOLN: INTRAVENOUS | Status: DC

## 2022-11-10 MED ORDER — PROPOFOL 10 MG/ML IV BOLUS
INTRAVENOUS | Status: DC | PRN
  Administered 2022-11-10: 180 mg via INTRAVENOUS

## 2022-11-10 MED ORDER — OXYCODONE HCL 5 MG PO TABS
ORAL_TABLET | ORAL | Status: AC
  Filled 2022-11-10: qty 1

## 2022-11-10 MED ORDER — 0.9 % SODIUM CHLORIDE (POUR BTL) OPTIME
TOPICAL | Status: DC | PRN
  Administered 2022-11-10: 500 mL

## 2022-11-10 MED ORDER — AMISULPRIDE (ANTIEMETIC) 5 MG/2ML IV SOLN
INTRAVENOUS | Status: AC
  Filled 2022-11-10: qty 4

## 2022-11-10 MED ORDER — OXYCODONE HCL 5 MG/5ML PO SOLN: 5.0000 mg | ORAL | Status: DC | PRN

## 2022-11-10 MED ORDER — SCOPOLAMINE 1 MG/3DAYS TD PT72
1.0000 | MEDICATED_PATCH | TRANSDERMAL | Status: DC
  Administered 2022-11-10: 1.5 mg via TRANSDERMAL

## 2022-11-10 MED ORDER — HYDROMORPHONE HCL 2 MG/ML IJ SOLN
INTRAMUSCULAR | Status: AC
  Filled 2022-11-10: qty 1

## 2022-11-10 MED ORDER — LIDOCAINE 2% (20 MG/ML) 5 ML SYRINGE
INTRAMUSCULAR | Status: DC | PRN
  Administered 2022-11-10: 60 mg via INTRAVENOUS

## 2022-11-10 MED ORDER — GABAPENTIN 300 MG PO CAPS
ORAL_CAPSULE | ORAL | Status: AC
  Filled 2022-11-10: qty 1

## 2022-11-10 MED ORDER — DOCUSATE SODIUM 100 MG PO CAPS: 100.0000 mg | ORAL_CAPSULE | Freq: Two times a day (BID) | ORAL | Status: DC

## 2022-11-10 MED ORDER — MIDAZOLAM HCL 5 MG/5ML IJ SOLN
INTRAMUSCULAR | Status: DC | PRN
  Administered 2022-11-10: 2 mg via INTRAVENOUS

## 2022-11-10 MED ORDER — ALBUMIN HUMAN 5 % IV SOLN
INTRAVENOUS | Status: AC
  Filled 2022-11-10: qty 250

## 2022-11-10 MED ORDER — SCOPOLAMINE 1 MG/3DAYS TD PT72: 1.0000 | MEDICATED_PATCH | TRANSDERMAL | Status: DC

## 2022-11-10 MED ORDER — HYDROMORPHONE HCL 1 MG/ML IJ SOLN
INTRAMUSCULAR | Status: DC | PRN
  Administered 2022-11-10: .5 mg via INTRAVENOUS

## 2022-11-10 MED ORDER — ONDANSETRON HCL 4 MG/2ML IJ SOLN
INTRAMUSCULAR | Status: AC
  Filled 2022-11-10: qty 2

## 2022-11-10 MED ORDER — MIDAZOLAM HCL 2 MG/2ML IJ SOLN
INTRAMUSCULAR | Status: AC
  Filled 2022-11-10: qty 2

## 2022-11-10 MED ORDER — SUGAMMADEX SODIUM 200 MG/2ML IV SOLN
INTRAVENOUS | Status: DC | PRN
  Administered 2022-11-10: 200 mg via INTRAVENOUS

## 2022-11-10 MED ORDER — CELECOXIB 200 MG PO CAPS
400.0000 mg | ORAL_CAPSULE | ORAL | Status: AC
  Administered 2022-11-10: 400 mg via ORAL

## 2022-11-10 MED ORDER — ROCURONIUM BROMIDE 10 MG/ML (PF) SYRINGE
PREFILLED_SYRINGE | INTRAVENOUS | Status: DC | PRN
  Administered 2022-11-10: 20 mg via INTRAVENOUS
  Administered 2022-11-10: 70 mg via INTRAVENOUS
  Administered 2022-11-10: 10 mg via INTRAVENOUS

## 2022-11-10 MED ORDER — ACETAMINOPHEN 500 MG PO TABS
1000.0000 mg | ORAL_TABLET | Freq: Four times a day (QID) | ORAL | Status: DC
  Administered 2022-11-10: 1000 mg via ORAL

## 2022-11-10 MED ORDER — ALBUMIN HUMAN 5 % IV SOLN: INTRAVENOUS | Status: DC | PRN

## 2022-11-10 MED ORDER — ENSURE PRE-SURGERY PO LIQD: 592.0000 mL | Freq: Once | ORAL | Status: DC

## 2022-11-10 MED ORDER — ACETAMINOPHEN 500 MG PO TABS
ORAL_TABLET | ORAL | Status: AC
  Filled 2022-11-10: qty 2

## 2022-11-10 MED ORDER — MENTHOL 3 MG MT LOZG
LOZENGE | OROMUCOSAL | Status: AC
  Filled 2022-11-10: qty 9

## 2022-11-10 MED ORDER — SODIUM CHLORIDE 0.9 % IV SOLN
INTRAVENOUS | Status: AC
  Filled 2022-11-10: qty 2

## 2022-11-10 MED ORDER — AMISULPRIDE (ANTIEMETIC) 5 MG/2ML IV SOLN
10.0000 mg | Freq: Once | INTRAVENOUS | Status: AC | PRN
  Administered 2022-11-10: 10 mg via INTRAVENOUS

## 2022-11-10 MED ORDER — IBUPROFEN 200 MG PO TABS
600.0000 mg | ORAL_TABLET | Freq: Four times a day (QID) | ORAL | Status: DC
  Administered 2022-11-10: 600 mg via ORAL

## 2022-11-10 MED ORDER — PROMETHAZINE HCL 25 MG/ML IJ SOLN: 6.2500 mg | INTRAMUSCULAR | Status: DC | PRN

## 2022-11-10 MED ORDER — GABAPENTIN 300 MG PO CAPS
300.0000 mg | ORAL_CAPSULE | ORAL | Status: AC
  Administered 2022-11-10: 300 mg via ORAL

## 2022-11-10 MED ORDER — SODIUM CHLORIDE 0.9 % IR SOLN
Status: DC | PRN
  Administered 2022-11-10: 650 mL

## 2022-11-10 MED ORDER — DEXAMETHASONE SODIUM PHOSPHATE 10 MG/ML IJ SOLN
INTRAMUSCULAR | Status: AC
  Filled 2022-11-10: qty 1

## 2022-11-10 MED ORDER — GLYCOPYRROLATE PF 0.2 MG/ML IJ SOSY
PREFILLED_SYRINGE | INTRAMUSCULAR | Status: AC
  Filled 2022-11-10: qty 1

## 2022-11-10 MED ORDER — ONDANSETRON HCL 4 MG PO TABS: 4.0000 mg | ORAL_TABLET | Freq: Four times a day (QID) | ORAL | Status: DC | PRN

## 2022-11-10 MED ORDER — IBUPROFEN 200 MG PO TABS: ORAL_TABLET | ORAL | 1 refills | Status: AC

## 2022-11-10 MED ORDER — DEXAMETHASONE SODIUM PHOSPHATE 10 MG/ML IJ SOLN
INTRAMUSCULAR | Status: DC | PRN
  Administered 2022-11-10: 10 mg via INTRAVENOUS

## 2022-11-10 MED ORDER — ACETAMINOPHEN 500 MG PO TABS
1000.0000 mg | ORAL_TABLET | ORAL | Status: AC
  Administered 2022-11-10: 1000 mg via ORAL

## 2022-11-10 MED ORDER — PROPOFOL 10 MG/ML IV BOLUS
INTRAVENOUS | Status: AC
  Filled 2022-11-10: qty 20

## 2022-11-10 MED ORDER — SODIUM CHLORIDE 0.9 % IV SOLN
2.0000 g | INTRAVENOUS | Status: AC
  Administered 2022-11-10: 2 g via INTRAVENOUS

## 2022-11-10 MED ORDER — FENTANYL CITRATE (PF) 100 MCG/2ML IJ SOLN
25.0000 ug | INTRAMUSCULAR | Status: DC | PRN
  Administered 2022-11-10 (×2): 25 ug via INTRAVENOUS

## 2022-11-10 MED ORDER — ONDANSETRON HCL 4 MG/2ML IJ SOLN: 4.0000 mg | Freq: Four times a day (QID) | INTRAMUSCULAR | Status: DC | PRN

## 2022-11-10 MED ORDER — LIDOCAINE HCL (PF) 2 % IJ SOLN
INTRAMUSCULAR | Status: AC
  Filled 2022-11-10: qty 10

## 2022-11-10 MED ORDER — OXYCODONE HCL 5 MG PO TABS: ORAL_TABLET | ORAL | 0 refills | Status: AC

## 2022-11-10 MED ORDER — MENTHOL 3 MG MT LOZG: 1.0000 | LOZENGE | OROMUCOSAL | Status: DC | PRN

## 2022-11-10 MED ORDER — ONDANSETRON HCL 4 MG/2ML IJ SOLN
INTRAMUSCULAR | Status: DC | PRN
  Administered 2022-11-10: 4 mg via INTRAVENOUS

## 2022-11-10 MED ORDER — ACETAMINOPHEN 500 MG PO TABS: ORAL_TABLET | ORAL | 1 refills | Status: AC

## 2022-11-10 MED ORDER — FENTANYL CITRATE (PF) 100 MCG/2ML IJ SOLN
INTRAMUSCULAR | Status: DC | PRN
  Administered 2022-11-10: 100 ug via INTRAVENOUS
  Administered 2022-11-10: 50 ug via INTRAVENOUS

## 2022-11-10 SURGICAL SUPPLY — 80 items
ADH SKN CLS APL DERMABOND .7 (GAUZE/BANDAGES/DRESSINGS) ×1
APL SWBSTK 6 STRL LF DISP (MISCELLANEOUS) ×2
APPLICATOR COTTON TIP 6 STRL (MISCELLANEOUS) IMPLANT
APPLICATOR COTTON TIP 6IN STRL (MISCELLANEOUS) ×2
BAG SPEC RTRVL LRG 6X4 10 (ENDOMECHANICALS) ×1
BARRIER ADHS 3X4 INTERCEED (GAUZE/BANDAGES/DRESSINGS) ×1 IMPLANT
BLADE SURG 10 STRL SS (BLADE) IMPLANT
BRR ADH 4X3 ABS CNTRL BYND (GAUZE/BANDAGES/DRESSINGS) ×1
CNTNR URN SCR LID CUP LEK RST (MISCELLANEOUS) IMPLANT
CONT SPEC 4OZ STRL OR WHT (MISCELLANEOUS) ×1
COVER BACK TABLE 60X90IN (DRAPES) ×1 IMPLANT
COVER TIP SHEARS 8 DVNC (MISCELLANEOUS) ×1 IMPLANT
COVER TIP SHEARS 8MM DA VINCI (MISCELLANEOUS) ×1
DEFOGGER SCOPE WARMER CLEARIFY (MISCELLANEOUS) ×1 IMPLANT
DERMABOND ADVANCED .7 DNX12 (GAUZE/BANDAGES/DRESSINGS) ×1 IMPLANT
DRAPE ARM DVNC X/XI (DISPOSABLE) ×4 IMPLANT
DRAPE COLUMN DVNC XI (DISPOSABLE) ×1 IMPLANT
DRAPE DA VINCI XI ARM (DISPOSABLE) ×4
DRAPE DA VINCI XI COLUMN (DISPOSABLE) ×1
DRAPE SURG IRRIG POUCH 19X23 (DRAPES) ×1 IMPLANT
DRAPE UTILITY XL STRL (DRAPES) IMPLANT
DURAPREP 26ML APPLICATOR (WOUND CARE) ×1 IMPLANT
ELECT REM PT RETURN 9FT ADLT (ELECTROSURGICAL) ×1
ELECTRODE REM PT RTRN 9FT ADLT (ELECTROSURGICAL) ×1 IMPLANT
GAUZE 4X4 16PLY ~~LOC~~+RFID DBL (SPONGE) ×2 IMPLANT
GAUZE VASELINE 3X9 (GAUZE/BANDAGES/DRESSINGS) IMPLANT
GLOVE BIOGEL PI IND STRL 7.0 (GLOVE) ×6 IMPLANT
GLOVE ECLIPSE 6.5 STRL STRAW (GLOVE) ×3 IMPLANT
IRRIG SUCT STRYKERFLOW 2 WTIP (MISCELLANEOUS) ×1
IRRIGATION SUCT STRKRFLW 2 WTP (MISCELLANEOUS) ×1 IMPLANT
IV NS 1000ML (IV SOLUTION) ×1
IV NS 1000ML BAXH (IV SOLUTION) IMPLANT
KIT PINK PAD W/HEAD ARE REST (MISCELLANEOUS) ×1
KIT PINK PAD W/HEAD ARM REST (MISCELLANEOUS) ×2 IMPLANT
KIT TURNOVER CYSTO (KITS) ×1 IMPLANT
LEGGING LITHOTOMY PAIR STRL (DRAPES) ×1 IMPLANT
MANIPULATOR UTERINE 4.5 ZUMI (MISCELLANEOUS) IMPLANT
NDL SPNL 22GX7 QUINCKE BK (NEEDLE) ×1 IMPLANT
NEEDLE HYPO 22GX1.5 SAFETY (NEEDLE) ×1 IMPLANT
NEEDLE SPNL 22GX7 QUINCKE BK (NEEDLE) ×1 IMPLANT
NS IRRIG 1000ML POUR BTL (IV SOLUTION) ×1 IMPLANT
NS IRRIG 500ML POUR BTL (IV SOLUTION) IMPLANT
OBTURATOR OPTICAL STANDARD 8MM (TROCAR) ×1
OBTURATOR OPTICAL STND 8 DVNC (TROCAR) ×1
OBTURATOR OPTICALSTD 8 DVNC (TROCAR) IMPLANT
PACK ROBOT WH (CUSTOM PROCEDURE TRAY) ×1 IMPLANT
PACK ROBOTIC GOWN (GOWN DISPOSABLE) ×1 IMPLANT
PAD PREP 24X48 CUFFED NSTRL (MISCELLANEOUS) ×1 IMPLANT
POUCH LAPAROSCOPIC INSTRUMENT (MISCELLANEOUS) ×1 IMPLANT
POUCH SPECIMEN RETRIEVAL 10MM (ENDOMECHANICALS) IMPLANT
PROTECTOR NERVE ULNAR (MISCELLANEOUS) ×2 IMPLANT
SEAL CANN UNIV 5-8 DVNC XI (MISCELLANEOUS) ×4 IMPLANT
SEAL XI 5MM-8MM UNIVERSAL (MISCELLANEOUS) ×4
SET TRI-LUMEN FLTR TB AIRSEAL (TUBING) ×1 IMPLANT
SPIKE FLUID TRANSFER (MISCELLANEOUS) ×2 IMPLANT
SPONGE T-LAP 4X18 ~~LOC~~+RFID (SPONGE) ×1 IMPLANT
STRIP CLOSURE SKIN 1/4X4 (GAUZE/BANDAGES/DRESSINGS) ×1 IMPLANT
SUT DVC VLOC 180 0 12IN GS21 (SUTURE) ×2
SUT DVC VLOC 180 2-0 12IN GS21 (SUTURE) ×1
SUT MNCRL AB 3-0 PS2 27 (SUTURE) ×2 IMPLANT
SUT VIC AB 0 CT2 27 (SUTURE) IMPLANT
SUT VIC AB 3-0 CT1 27 (SUTURE) ×1
SUT VIC AB 3-0 CT1 TAPERPNT 27 (SUTURE) IMPLANT
SUT VICRYL 0 UR6 27IN ABS (SUTURE) ×1 IMPLANT
SUT VLOC 180 0 6IN GS21 (SUTURE) ×1 IMPLANT
SUT VLOC 180 0 9IN  GS21 (SUTURE)
SUT VLOC 180 0 9IN GS21 (SUTURE) IMPLANT
SUT VLOC 180 2-0 6IN GS21 (SUTURE) IMPLANT
SUT VLOC 180 2-0 9IN GS21 (SUTURE) IMPLANT
SUTURE DVC VL 180 2-0 12INGS21 (SUTURE) IMPLANT
SUTURE DVC VLC 180 0 12IN GS21 (SUTURE) IMPLANT
SYSTEM CARTER THOMASON II (TROCAR) IMPLANT
TIP UTERINE 5.1X6CM LAV DISP (MISCELLANEOUS) IMPLANT
TIP UTERINE 6.7X10CM GRN DISP (MISCELLANEOUS) IMPLANT
TIP UTERINE 6.7X6CM WHT DISP (MISCELLANEOUS) IMPLANT
TIP UTERINE 6.7X8CM BLUE DISP (MISCELLANEOUS) IMPLANT
TOWEL OR 17X26 10 PK STRL BLUE (TOWEL DISPOSABLE) ×1 IMPLANT
TRAY FOLEY W/BAG SLVR 14FR LF (SET/KITS/TRAYS/PACK) ×1 IMPLANT
TROCAR PORT AIRSEAL 8X120 (TROCAR) ×1 IMPLANT
TROCAR Z-THREAD FIOS 11X100 BL (TROCAR) IMPLANT

## 2022-11-10 NOTE — Op Note (Signed)
Preoperative diagnosis: Symptomatic uterine fibroids   Postoperative diagnosis: Same   Anesthesia: General   Anesthesiologist: Dr. Tobias Alexander  Procedure: Robotically assisted myomectomy   Surgeon: Dr. Katharine Look Anis Cinelli   Assistant: Earnstine Regal P.A.-C .  Estimated blood loss: 50 cc   Procedure:   After being informed of the planned procedure with possible complications including but not limited to bleeding, infection, injury to other organs, need for laparotomy, possible need for morcellation with risks and benefits reviewed, expected hospital stay and recovery, informed consent is obtained and patient is taken to or #5. She is placed in lithotomy position on Pink Pad with both arms padded and tucked on each side and bilateral knee-high sequential compressive devices. She is given general anesthesia with endotracheal intubation without any complication. She is prepped and draped in a sterile fashion. A three-way Foley catheter is inserted in her bladder.   Pelvic exam reveals: anteverted uterus with a 6-8 cm right anterior fibroid and 2 normal adnexa  A weighted speculum is inserted in the vagina and the anterior lip of the cervix is grasped with a tenaculum forcep. We proceed with a paracervical block and vaginal infiltration using ropivacaine 0.5% diluted 1 in 1 with saline. The uterus was then sounded at 11 cm. We easily dilate the cervix using Hegar dilator to #27 which allows for easy placement of the intrauterine RUMI manipulator.   Trocar placement is decided. We infiltrate at the upper edge of  the umbilicus with 10 cc of ropivacaine per protocol and perform a 10 mm vertical incision which is brought down bluntly to the fascia. The fascia is identified and grasped with Coker forceps. The fascia is incised with Mayo scissors. Peritoneum is entered bluntly. A pursestring suture of 0 Vicryl is placed on the fascia and a 10 mm Hassan trocar is easily inserted in the abdominal cavity held in  placed with a Purstring suture. This allows for easy insufflation of a pneumoperitoneum using warmed CO2 at a maximum pressure of 15 mm of mercury. 60 cc of Ropivacaine 0.5 % diluted 1 in 1 is sent in the pelvis and the patient is positioned in reverse Trendelenburg. We then placed two 10m robotic trocar on the left, one 881mrobotic trocar on the right and one 10 mm patient's side assistant trocar on the right after infiltrating every site with ropivacaine per protocol. The robot is docked on the right of the patient after positioning in Trendelenburg. A monopolar scissor is inserted in arm #4, a Long bipolar forceps is inserted in arm #2 and a Tenaculum is inserted in arm #1  Preparation and docking is completed in 46 minutes.   Observation: The uterus is enlarged with 8 cm right anterior fibroid . Both tubes and ovaries are normal except for a small adhesion between the left tube and the sigmoid which is taken down. . Both anterior and posterior cul-de-sac are normal. Liver is visualized and normal. Appendix is visualized and normal. There are adhesions between the right colon and the anterior abdominal wall not interfering with our trocar placement.   Fibroid #1:8 cm right anterior fundal Fibroid #2: 3 cm anterior intramural found under the dominant fibroid  We proceed by infiltrating all the previously described fibroids using vasopressin at a dilution of 20 units in 100 cc of saline until we obtain complete blanching. Using monopolar scissors, the serosa of each fibroid is incised. The fibroid is grasped with Tenaculum. Using sharp and blunt dissection, the 2 fibroids are extracted from the  myometrium.   The myometrial defects are repaired in multiple layers of running sutures of 0 V-Lock. All serosal defect of the anterior midline vertical incision is closed with a baseball suture of 2-0 V-Lock.  We irrigated profusely to confirm a satisfactory hemostasis. A sheet of Interceed is placed on the  myometrial defect. Console  time is completed in 1 hour and 24 minutes.   The camera port is replaced by a 12 mm trocar through which a 10 mm endobag is introduced. The 2 fibroids are placed in the bag.   All instruments are then removed and the robot is undocked.   We then proceed with C-morcellation of the specimen which takes 16 minutes.   We irrigate profusely and again confirm a satisfactory hemostasis.   All trochars are removed under direct visualization after evacuating the pneumoperitoneum.   The fascia of the supraumbilical incision is closed with a pursestring suture of 0 Vicryl. All incisions are then closed with subcuticular suture of 3-0 Monocryl and Dermabond.   A speculum is inserted in the vagina. A figure-of-eight suture of 3-0 Vicryl is placed on the anterior lip for adequate hemostasis on the cervix.   Instrument and sponge count is complete x2. Estimated blood loss is 50 cc.   The procedure is well tolerated by the patient who  is taken to recovery room in a well and stable condition.    Dr Cletis Media was present and scrubbed at all times. Surgical assistance was required due to the complexity of the anatomy and procedure.   NUMBER OF FIBROIDS REMOVED: 2  NUMBER OF MYOMETRIAL INCISIONS: 1  ENDOMETRIAL CAVITY ENTRY: no  CESAREAN DELIVERY RECOMMENDED: yes  Specimen: Fibroids weighing 145 g sent to pathology.

## 2022-11-10 NOTE — Discharge Instructions (Signed)
Call [facility] at [phone] if:   You have a temperature greater than or equal to 100.4 degrees Farenheit orally You have pain that is not made better by the pain medication given and taken as directed You have excessive bleeding or problems urinating  Take Colace (Docusate Sodium/Stool Softener) 100 mg 2-3 times daily while taking narcotic pain medicine to avoid constipation or until bowel movements are regular.  Take, with food, Ibuprofen 600 mg and Acetaminophen 500 mg (#2 tablets) every 6 hours for 5 days then as needed for post operative pain  You may drive after 2 weeks You may walk up steps  You may shower tomorrow You may resume a regular diet  Keep incisions clean and dry Do not lift over 15 pounds for 6 weeks Avoid anything in vagina for 6 weeks.

## 2022-11-10 NOTE — Progress Notes (Signed)
Day of Surgery Procedure(s) (LRB): XI ROBOTIC ASSISTED MYOMECTOMY (N/A)  Subjective: Patient reports that pain is well managed.  Tolerating normal diet without difficulty. No nausea / vomiting.  Ambulating and voiding.  Objective: BP 116/87 (BP Location: Right Arm)   Pulse 99   Temp 98.5 F (36.9 C)   Resp 16   Ht '5\' 2"'$  (1.575 m)   Wt 87.5 kg   LMP 09/26/2022 (Approximate)   SpO2 95%   Breastfeeding Unknown Comment: Patient had a miscarriage on 06/17/22.  BMI 35.28 kg/m   Assessment: s/p Procedure(s): XI ROBOTIC ASSISTED MYOMECTOMY: progressing well  Plan:  Op findings reviewed with patient. Questions answered Discharge home  LOS: 0 days    [name] 11/10/2022, 3:57 PM

## 2022-11-10 NOTE — Anesthesia Postprocedure Evaluation (Signed)
Anesthesia Post Note  Patient: Rhonda Pierce  Procedure(s) Performed: XI ROBOTIC ASSISTED MYOMECTOMY (Abdomen)     Patient location during evaluation: PACU Anesthesia Type: General Level of consciousness: sedated Pain management: pain level controlled Vital Signs Assessment: post-procedure vital signs reviewed and stable Respiratory status: spontaneous breathing and respiratory function stable Cardiovascular status: stable Postop Assessment: no apparent nausea or vomiting Anesthetic complications: no   No notable events documented.  Last Vitals:  Vitals:   11/10/22 1200 11/10/22 1215  BP: 108/78 118/85  Pulse: 97 97  Resp: 15 17  Temp:    SpO2: 100% 94%    Last Pain:  Vitals:   11/10/22 1156  TempSrc:   PainSc: Asleep                 Kaylin Marcon DANIEL

## 2022-11-10 NOTE — Anesthesia Procedure Notes (Signed)
Procedure Name: Intubation Date/Time: 11/10/2022 7:40 AM  Performed by: Paz Fuentes D, CRNAPre-anesthesia Checklist: Patient identified, Emergency Drugs available, Suction available and Patient being monitored Patient Re-evaluated:Patient Re-evaluated prior to induction Oxygen Delivery Method: Circle system utilized Preoxygenation: Pre-oxygenation with 100% oxygen Induction Type: IV induction Ventilation: Mask ventilation without difficulty Laryngoscope Size: Mac and 3 Grade View: Grade I Tube type: Oral Tube size: 7.0 mm Number of attempts: 1 Airway Equipment and Method: Stylet Placement Confirmation: ETT inserted through vocal cords under direct vision, positive ETCO2 and breath sounds checked- equal and bilateral Tube secured with: Tape Dental Injury: Teeth and Oropharynx as per pre-operative assessment

## 2022-11-10 NOTE — Transfer of Care (Signed)
Immediate Anesthesia Transfer of Care Note  Patient: Rhonda Pierce  Procedure(s) Performed: XI ROBOTIC ASSISTED MYOMECTOMY (Abdomen)  Patient Location: PACU  Anesthesia Type:General  Level of Consciousness: awake, alert , oriented, and patient cooperative  Airway & Oxygen Therapy: Patient Spontanous Breathing  Post-op Assessment: Report given to RN and Post -op Vital signs reviewed and stable  Post vital signs: Reviewed and stable  Last Vitals:  Vitals Value Taken Time  BP 122/89 11/10/22 1100  Temp    Pulse 99 11/10/22 1101  Resp 16 11/10/22 1101  SpO2 95 % 11/10/22 1101  Vitals shown include unvalidated device data.  Last Pain:  Vitals:   11/10/22 0604  TempSrc: Oral  PainSc: 0-No pain      Patients Stated Pain Goal: 5 (00/37/04 8889)  Complications: No notable events documented.

## 2022-11-10 NOTE — Interval H&P Note (Signed)
History and Physical Interval Note:  11/10/2022 7:31 AM  Rhonda Pierce  has presented today for surgery, with the diagnosis of uterine fibroids.  The various methods of treatment have been discussed with the patient and family. After consideration of risks, benefits and other options for treatment, the patient has consented to  Procedure(s): XI ROBOTIC ASSISTED MYOMECTOMY (N/A) as a surgical intervention.  The patient's history has been reviewed, patient examined, no change in status, stable for surgery.  I have reviewed the patient's chart and labs.  Questions were answered to the patient's satisfaction.     Katharine Look A Earlean Fidalgo

## 2022-11-11 LAB — SURGICAL PATHOLOGY

## 2022-11-14 ENCOUNTER — Encounter (HOSPITAL_BASED_OUTPATIENT_CLINIC_OR_DEPARTMENT_OTHER): Payer: Self-pay | Admitting: Obstetrics and Gynecology

## 2023-11-23 ENCOUNTER — Other Ambulatory Visit: Payer: Self-pay

## 2023-11-23 ENCOUNTER — Emergency Department (HOSPITAL_BASED_OUTPATIENT_CLINIC_OR_DEPARTMENT_OTHER)
Admission: EM | Admit: 2023-11-23 | Discharge: 2023-11-23 | Disposition: A | Discharge status: Home / Self Care | Payer: 59 | Attending: Emergency Medicine | Admitting: Emergency Medicine

## 2023-11-23 DIAGNOSIS — X58XXXA Exposure to other specified factors, initial encounter: Secondary | ICD-10-CM | POA: Diagnosis not present

## 2023-11-23 DIAGNOSIS — T452X1A Poisoning by vitamins, accidental (unintentional), initial encounter: Secondary | ICD-10-CM | POA: Insufficient documentation

## 2023-11-23 DIAGNOSIS — Z87891 Personal history of nicotine dependence: Secondary | ICD-10-CM | POA: Insufficient documentation

## 2023-11-23 DIAGNOSIS — T50901A Poisoning by unspecified drugs, medicaments and biological substances, accidental (unintentional), initial encounter: Secondary | ICD-10-CM | POA: Diagnosis present

## 2023-11-23 DIAGNOSIS — I10 Essential (primary) hypertension: Secondary | ICD-10-CM | POA: Insufficient documentation

## 2023-11-23 LAB — COMPREHENSIVE METABOLIC PANEL
ALT: 89 U/L — ABNORMAL HIGH (ref 0–44)
AST: 78 U/L — ABNORMAL HIGH (ref 15–41)
Albumin: 4.2 g/dL (ref 3.5–5.0)
Alkaline Phosphatase: 60 U/L (ref 38–126)
Anion gap: 10 (ref 5–15)
BUN: 11 mg/dL (ref 6–20)
CO2: 24 mmol/L (ref 22–32)
Calcium: 9 mg/dL (ref 8.9–10.3)
Chloride: 105 mmol/L (ref 98–111)
Creatinine, Ser: 0.78 mg/dL (ref 0.44–1.00)
GFR, Estimated: >60 mL/min (ref >60)
Glucose, Bld: 123 mg/dL — ABNORMAL HIGH (ref 70–99)
Potassium: 3.5 mmol/L (ref 3.5–5.1)
Sodium: 139 mmol/L (ref 135–145)
Total Bilirubin: 0.4 mg/dL (ref 0.0–1.2)
Total Protein: 7.7 g/dL (ref 6.5–8.1)

## 2023-11-23 LAB — CBC WITH DIFFERENTIAL/PLATELET
Abs Immature Granulocytes: 0.02 10*3/uL (ref 0.00–0.07)
Basophils Absolute: 0 10*3/uL (ref 0.0–0.1)
Basophils Relative: 0 %
Eosinophils Absolute: 0.2 10*3/uL (ref 0.0–0.5)
Eosinophils Relative: 3 %
HCT: 37.9 % (ref 36.0–46.0)
Hemoglobin: 12.2 g/dL (ref 12.0–15.0)
Immature Granulocytes: 0 %
Lymphocytes Relative: 19 %
Lymphs Abs: 1.2 10*3/uL (ref 0.7–4.0)
MCH: 23.4 pg — ABNORMAL LOW (ref 26.0–34.0)
MCHC: 32.2 g/dL (ref 30.0–36.0)
MCV: 72.6 fL — ABNORMAL LOW (ref 80.0–100.0)
Monocytes Absolute: 0.5 10*3/uL (ref 0.1–1.0)
Monocytes Relative: 8 %
Neutro Abs: 4.3 10*3/uL (ref 1.7–7.7)
Neutrophils Relative %: 70 %
Platelets: 269 10*3/uL (ref 150–400)
RBC: 5.22 MIL/uL — ABNORMAL HIGH (ref 3.87–5.11)
RDW: 13.8 % (ref 11.5–15.5)
WBC: 6.3 10*3/uL (ref 4.0–10.5)
nRBC: 0 % (ref 0.0–0.2)

## 2023-11-23 LAB — ETHANOL: Alcohol, Ethyl (B): <10 mg/dL (ref <10)

## 2023-11-23 LAB — RESP PANEL BY RT-PCR (RSV, FLU A&B, COVID)  RVPGX2
Influenza A by PCR: POSITIVE — AB
Influenza B by PCR: NEGATIVE
Resp Syncytial Virus by PCR: NEGATIVE
SARS Coronavirus 2 by RT PCR: NEGATIVE

## 2023-11-23 LAB — SALICYLATE LEVEL: Salicylate Lvl: <7 mg/dL — ABNORMAL LOW (ref 7.0–30.0)

## 2023-11-23 LAB — MAGNESIUM: Magnesium: 1.7 mg/dL (ref 1.7–2.4)

## 2023-11-23 LAB — ACETAMINOPHEN LEVEL: Acetaminophen (Tylenol), Serum: 11 ug/mL (ref 10–30)

## 2023-11-23 LAB — HCG, SERUM, QUALITATIVE: Preg, Serum: NEGATIVE

## 2023-11-23 NOTE — ED Provider Notes (Signed)
[place] [facility] Provider Note   CSN: [id number] Arrival date & time: 11/23/23  0930     History  Chief Complaint  Patient presents with   Drug Overdose    Rhonda Pierce is a 33 y.o. female.  Patient sent in by poison control for accidental vitamin D overdose.  Patient's primary care doctor had prescribed her 5000 units of vitamin D she was post to take that once a week but she has been taking it for the past 11 days.  Patient without any symptoms.  Her son does have flu at home.  Temp here was 99.1 pulse was 117 EKG consistent with a sinus tachycardia respirations 20 blood pressure elevated at 153/109 patient does not have a history of hypertension.  She will need to follow that.  Past medical history noncontributory patient is a former smoker of cigarettes.  Patient without any upper respiratory symptoms at all.  But she did feel as if maybe she was coming down with something.  Patient without any GI symptoms.  Had a little bit of loose bowel movements but she has been also been taking protein shakes.       Home Medications Prior to Admission medications   Medication Sig Start Date End Date Taking? Authorizing Provider  acetaminophen (TYLENOL) 500 MG tablet take 2 tablets every 6 hours x 5 days then prn-post operative pain 11/10/22   Henreitta Leber, PA-C  ibuprofen (ADVIL) 200 MG tablet take 1 tablet po pc every 6 hours for 5 days then prn for postoperative pain 11/10/22   Henreitta Leber, PA-C  oxyCODONE (ROXICODONE) 5 MG immediate release tablet take 1 tablet po every 6 hours as needed for breakthrough post operative pain 11/10/22   Henreitta Leber, PA-C      Allergies    Patient has no known allergies.    Review of Systems   Review of Systems  Constitutional:  Negative for chills and fever.  HENT:  Negative for ear pain and sore throat.   Eyes:  Negative for pain and visual disturbance.  Respiratory:  Negative for cough and shortness of  breath.   Cardiovascular:  Negative for chest pain and palpitations.  Gastrointestinal:  Negative for abdominal pain and vomiting.  Genitourinary:  Negative for dysuria and hematuria.  Musculoskeletal:  Negative for arthralgias and back pain.  Skin:  Negative for color change and rash.  Neurological:  Negative for seizures and syncope.  All other systems reviewed and are negative.   Physical Exam Updated Vital Signs BP (!) 153/109   Pulse (!) 117   Temp 99.1 F (37.3 C) (Oral)   Resp (!) 21   SpO2 99%  Physical Exam Vitals and nursing note reviewed.  Constitutional:      General: She is not in acute distress.    Appearance: Normal appearance. She is well-developed. She is not ill-appearing or toxic-appearing.  HENT:     Head: Normocephalic and atraumatic.     Mouth/Throat:     Mouth: Mucous membranes are moist.  Eyes:     Extraocular Movements: Extraocular movements intact.     Conjunctiva/sclera: Conjunctivae normal.     Pupils: Pupils are equal, round, and reactive to light.  Cardiovascular:     Rate and Rhythm: Normal rate and regular rhythm.     Heart sounds: No murmur heard. Pulmonary:     Effort: Pulmonary effort is normal. No respiratory distress.     Breath sounds: Normal breath sounds.  Abdominal:  Palpations: Abdomen is soft.     Tenderness: There is no abdominal tenderness.  Musculoskeletal:        General: No swelling.     Cervical back: Neck supple.  Skin:    General: Skin is warm and dry.     Capillary Refill: Capillary refill takes less than 2 seconds.  Neurological:     General: No focal deficit present.     Mental Status: She is alert and oriented to person, place, and time.  Psychiatric:        Mood and Affect: Mood normal.     ED Results / Procedures / Treatments   Labs (all labs ordered are listed, but only abnormal results are displayed) Labs Reviewed  RESP PANEL BY RT-PCR (RSV, FLU A&B, COVID)  RVPGX2 - Abnormal; Notable for the  following components:      Result Value   Influenza A by PCR POSITIVE (*)    All other components within normal limits  CBC WITH DIFFERENTIAL/PLATELET - Abnormal; Notable for the following components:   RBC 5.22 (*)    MCV 72.6 (*)    MCH 23.4 (*)    All other components within normal limits  COMPREHENSIVE METABOLIC PANEL - Abnormal; Notable for the following components:   Glucose, Bld 123 (*)    AST 78 (*)    ALT 89 (*)    All other components within normal limits  SALICYLATE LEVEL - Abnormal; Notable for the following components:   Salicylate Lvl <7.0 (*)    All other components within normal limits  MAGNESIUM  ETHANOL  ACETAMINOPHEN LEVEL  HCG, SERUM, QUALITATIVE  RAPID URINE DRUG SCREEN, HOSP PERFORMED  CALCITRIOL (1,25 DI-OH VIT D)    EKG EKG Interpretation Date/Time:  Thursday November 23 2023 09:49:56 EST Ventricular Rate:  108 PR Interval:  165 QRS Duration:  89 QT Interval:  349 QTC Calculation: 468 R Axis:   89  Text Interpretation: Sinus tachycardia No previous ECGs available Confirmed by Vanetta Mulders [phone]) on 11/23/2023 9:58:18 AM  Radiology No results found.  Procedures Procedures    Medications Ordered in ED Medications - No data to display  ED Course/ Medical Decision Making/ A&P                                 Medical Decision Making Amount and/or Complexity of Data Reviewed Labs: ordered.   Patient is essentially asymptomatic.  Poison control recommended checking magnesium calcium EKG and they wanted a vitamin D level.  Patient certainly not showing any signs of nausea vomiting weakness or frequent urination.  Patient CBC normal complete metabolic panel normal AST ALT up just a little bit 7889 bili is normal renal functions normal calcium level was 9.  Magnesium also normal at 1.7 alcohol level less than 10 Tylenol level was 11.  Salicylate level less than 7.  Patient has not been taking significant amount of Tylenol.  Pregnancy test was  negative.  Respiratory panel was positive for flu A.  She was concerned about this being possible because her son has flu a at home.  Patient not showing any symptoms of upper respiratory infection at this time.  Precautions provided to her.  Patient stable according to poison control for discharge home follow-up primary care doctor obviously hold any vitamin D or any multivitamins that have vitamin D unit at least for the next week.  Maybe 2 weeks.  Primary care doctor  can determine the results of the vitamin D level and decide when they want to initiate the vitamin D supplements again.  CRITICAL CARE Performed by: Vanetta Mulders Total critical care time: 35 minutes Critical care time was exclusive of separately billable procedures and treating other patients. Critical care was necessary to treat or prevent imminent or life-threatening deterioration. Critical care was time spent personally by me on the following activities: development of treatment plan with patient and/or surrogate as well as nursing, discussions with consultants, evaluation of patient's response to treatment, examination of patient, obtaining history from patient or surrogate, ordering and performing treatments and interventions, ordering and review of laboratory studies, ordering and review of radiographic studies, pulse oximetry and re-evaluation of patient's condition.  Final Clinical Impression(s) / ED Diagnoses Final diagnoses:  Vitamin D overdose, accidental or unintentional, initial encounter    Rx / DC Orders ED Discharge Orders     None         Vanetta Mulders, MD 11/23/23 1102

## 2023-11-23 NOTE — ED Notes (Signed)
Spoke with Rhonda Pierce at [facility], given Ca++ and Mg++ levels . No further monitoring needed from their standpoint. EDP informed

## 2023-11-23 NOTE — Discharge Instructions (Signed)
Poison control is cleared you for discharge home.  Obviously do not take vitamin D or any other vitamins until you follow-up with your primary care doctor.  Your vitamin D level is pending and we are told will take a couple days to come back.  Your respiratory panel was positive for flu A.  Currently you are asymptomatic for flu A but if you start to develop symptoms then over-the-counter cold and flu medicines.  Return for development of any new or worse symptoms.

## 2023-11-23 NOTE — ED Notes (Signed)
Spoke with Poison Control recommendations: EKG, CA++ and Vit D. Level. Informed that Vit D level is a send out and will take 2 days to result per lab.

## 2023-11-23 NOTE — ED Triage Notes (Signed)
Pt reports PCP prescribing 50,000 Units of vit D. She was unaware that its a weekly dose and not a daily dose. She has taken 50,000 Units daily for 11 days.   Son was diagnosed with flu yesterday and states she has a cough and sore throat that develops.

## 2023-11-25 LAB — CALCITRIOL (1,25 DI-OH VIT D): Vit D, 1,25-Dihydroxy: 70.4 pg/mL (ref 24.8–81.5)
